# Patient Record
Sex: Female | Born: 1975 | Race: White | Hispanic: No | Marital: Married | Smoking: Current every day smoker
Health system: Southern US, Community
[De-identification: ages and names within clinical notes are randomized; demographics above are authoritative.]

## PROBLEM LIST (undated history)

## (undated) DIAGNOSIS — F32A Depression, unspecified: Secondary | ICD-10-CM

## (undated) DIAGNOSIS — K8020 Calculus of gallbladder without cholecystitis without obstruction: Secondary | ICD-10-CM

## (undated) DIAGNOSIS — E282 Polycystic ovarian syndrome: Secondary | ICD-10-CM

## (undated) DIAGNOSIS — E119 Type 2 diabetes mellitus without complications: Secondary | ICD-10-CM

## (undated) DIAGNOSIS — F419 Anxiety disorder, unspecified: Secondary | ICD-10-CM

## (undated) DIAGNOSIS — F329 Major depressive disorder, single episode, unspecified: Secondary | ICD-10-CM

## (undated) DIAGNOSIS — E039 Hypothyroidism, unspecified: Secondary | ICD-10-CM

## (undated) HISTORY — DX: Type 2 diabetes mellitus without complications: E11.9

## (undated) HISTORY — DX: Polycystic ovarian syndrome: E28.2

## (undated) HISTORY — DX: Calculus of gallbladder without cholecystitis without obstruction: K80.20

## (undated) HISTORY — PX: OTHER SURGICAL HISTORY: SHX169

## (undated) HISTORY — DX: Depression, unspecified: F32.A

## (undated) HISTORY — DX: Anxiety disorder, unspecified: F41.9

---
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
## 2015-02-28 ENCOUNTER — Encounter (HOSPITAL_COMMUNITY): Payer: Self-pay | Admitting: *Deleted

## 2015-02-28 ENCOUNTER — Emergency Department (INDEPENDENT_AMBULATORY_CARE_PROVIDER_SITE_OTHER)
Admission: EM | Admit: 2015-02-28 | Discharge: 2015-02-28 | Disposition: A | Discharge status: Home / Self Care | Payer: 59 | Source: Home / Self Care | Attending: Family Medicine | Admitting: Family Medicine

## 2015-02-28 DIAGNOSIS — A0811 Acute gastroenteropathy due to Norwalk agent: Secondary | ICD-10-CM | POA: Diagnosis not present

## 2015-02-28 MED ORDER — ONDANSETRON 4 MG PO TBDP
ORAL_TABLET | ORAL | Status: AC
  Filled 2015-02-28: qty 2

## 2015-02-28 MED ORDER — ONDANSETRON HCL 4 MG PO TABS: 4.0000 mg | ORAL_TABLET | Freq: Four times a day (QID) | ORAL | Status: DC

## 2015-02-28 MED ORDER — ONDANSETRON 4 MG PO TBDP
8.0000 mg | ORAL_TABLET | Freq: Once | ORAL | Status: AC
  Administered 2015-02-28: 8 mg via ORAL

## 2015-02-28 NOTE — Discharge Instructions (Signed)
Clear liquid , bland diet tonight as tolerated, advance on tues as improved, use medicine as needed for vomiting, imodium for diarrhea, return or see your doctor if any problems.

## 2015-02-28 NOTE — ED Provider Notes (Signed)
CSN: [id number]     Arrival date & time 02/28/15  1836 History   First MD Initiated Contact with Patient 02/28/15 1950     Chief Complaint  Patient presents with  . Emesis   (Consider location/radiation/quality/duration/timing/severity/associated sxs/prior Treatment) Patient is a 40 y.o. female presenting with vomiting. The history is provided by the patient.  Emesis Severity:  Moderate Duration:  14 hours Timing:  Intermittent Number of daily episodes:  6 Quality:  Stomach contents (yellow fluid) Progression:  Unchanged Chronicity:  New Recent urination:  Normal Context comment:  Husband similar sick over weekend. Worsened by:  Nothing tried Ineffective treatments:  None tried Associated symptoms: abdominal pain, chills and diarrhea   Associated symptoms: no fever   Risk factors: sick contacts     History reviewed. No pertinent past medical history. History reviewed. No pertinent past surgical history. History reviewed. No pertinent family history. Social History  Substance Use Topics  . Smoking status: None  . Smokeless tobacco: None  . Alcohol Use: No   OB History    No data available     Review of Systems  Constitutional: Positive for chills.  HENT: Negative.   Gastrointestinal: Positive for nausea, vomiting, abdominal pain and diarrhea.  Genitourinary: Negative.   All other systems reviewed and are negative.   Allergies  Review of patient's allergies indicates no known allergies.  Home Medications   Prior to Admission medications   Medication Sig Start Date End Date Taking? Authorizing Provider  ondansetron (ZOFRAN) 4 MG tablet Take 1 tablet (4 mg total) by mouth every 6 (six) hours. Prn n/v 02/28/15   Billy Fischer, MD   Meds Ordered and Administered this Visit   Medications  ondansetron (ZOFRAN-ODT) disintegrating tablet 8 mg (8 mg Oral Given 02/28/15 2004)    BP 128/88 mmHg  Pulse 118  Temp(Src) 98.2 F (36.8 C) (Oral)  Resp 20  SpO2 100%  LMP  01/30/2015 No data found.   Physical Exam  Constitutional: She is oriented to person, place, and time. She appears well-developed and well-nourished. No distress.  HENT:  Mouth/Throat: Oropharynx is clear and moist.  Neck: Normal range of motion. Neck supple.  Cardiovascular: Normal heart sounds and intact distal pulses.   Pulmonary/Chest: Effort normal and breath sounds normal.  Abdominal: Soft. Normal appearance and bowel sounds are normal. She exhibits no distension and no mass. There is tenderness. There is no rigidity, no rebound, no guarding and no CVA tenderness.    Lymphadenopathy:    She has no cervical adenopathy.  Neurological: She is alert and oriented to person, place, and time.  Skin: Skin is warm and dry.    ED Course  Procedures (including critical care time)  Labs Review Labs Reviewed - No data to display  Imaging Review No results found.   Visual Acuity Review  Right Eye Distance:   Left Eye Distance:   Bilateral Distance:    Right Eye Near:   Left Eye Near:    Bilateral Near:         MDM   1. Gastroenteritis due to norovirus    Meds ordered this encounter  Medications  . ondansetron (ZOFRAN-ODT) disintegrating tablet 8 mg    Sig:   . ondansetron (ZOFRAN) 4 MG tablet    Sig: Take 1 tablet (4 mg total) by mouth every 6 (six) hours. Prn n/v    Dispense:  12 tablet    Refill:  0       Billy Fischer,  MD 02/28/15 2011

## 2015-02-28 NOTE — ED Notes (Signed)
Pt  Reports  Symptoms  Of  Vomiting   Diarrhea     And  Lower r  Side  Abdominal   Pain          Pt  States   She  Woke  Up  With  The  Symptoms  About      4  Am  This  Morning     She  Is  Sitting  Upright on  The  Exam table  Speaking in  Complete  sentances       She  States   Is  Unable  To  Yolonda Kida     Liquids  Down

## 2015-05-09 ENCOUNTER — Other Ambulatory Visit: Payer: Self-pay | Admitting: Nurse Practitioner

## 2015-05-09 DIAGNOSIS — K819 Cholecystitis, unspecified: Secondary | ICD-10-CM

## 2015-05-16 ENCOUNTER — Other Ambulatory Visit: Payer: 59

## 2015-05-16 ENCOUNTER — Other Ambulatory Visit: Payer: Self-pay | Admitting: Nurse Practitioner

## 2015-05-16 DIAGNOSIS — K819 Cholecystitis, unspecified: Secondary | ICD-10-CM

## 2015-05-26 ENCOUNTER — Ambulatory Visit
Admission: RE | Admit: 2015-05-26 | Discharge: 2015-05-26 | Disposition: A | Discharge status: Home / Self Care | Payer: 59 | Source: Ambulatory Visit | Attending: Nurse Practitioner | Admitting: Nurse Practitioner

## 2015-05-26 DIAGNOSIS — K819 Cholecystitis, unspecified: Secondary | ICD-10-CM

## 2015-05-26 MED ORDER — GADOBENATE DIMEGLUMINE 529 MG/ML IV SOLN
20.0000 mL | Freq: Once | INTRAVENOUS | Status: AC | PRN
  Administered 2015-05-26: 20 mL via INTRAVENOUS

## 2017-05-17 ENCOUNTER — Encounter (HOSPITAL_COMMUNITY): Payer: Self-pay | Admitting: Emergency Medicine

## 2017-05-17 ENCOUNTER — Emergency Department (HOSPITAL_COMMUNITY)
Admission: EM | Admit: 2017-05-17 | Discharge: 2017-05-17 | Disposition: A | Discharge status: Home / Self Care | Payer: PRIVATE HEALTH INSURANCE | Attending: Emergency Medicine | Admitting: Emergency Medicine

## 2017-05-17 ENCOUNTER — Other Ambulatory Visit: Payer: Self-pay

## 2017-05-17 DIAGNOSIS — F17210 Nicotine dependence, cigarettes, uncomplicated: Secondary | ICD-10-CM | POA: Insufficient documentation

## 2017-05-17 DIAGNOSIS — B350 Tinea barbae and tinea capitis: Secondary | ICD-10-CM | POA: Diagnosis not present

## 2017-05-17 DIAGNOSIS — R599 Enlarged lymph nodes, unspecified: Secondary | ICD-10-CM

## 2017-05-17 DIAGNOSIS — M542 Cervicalgia: Secondary | ICD-10-CM | POA: Diagnosis present

## 2017-05-17 MED ORDER — TERBINAFINE HCL 250 MG PO TABS: 250.0000 mg | ORAL_TABLET | Freq: Every day | ORAL | 0 refills | Status: DC

## 2017-05-17 MED ORDER — HYDROCODONE-ACETAMINOPHEN 5-325 MG PO TABS: 1.0000 | ORAL_TABLET | Freq: Four times a day (QID) | ORAL | 0 refills | Status: DC | PRN

## 2017-05-17 NOTE — ED Triage Notes (Signed)
Pt. Stated, Beth Savage had a lesion on my scalp for 2 weeks, and a knot on the back of my neck

## 2017-05-17 NOTE — ED Triage Notes (Signed)
Went to 'Dr. Wilburn Mylar and was given a cream

## 2017-05-17 NOTE — ED Provider Notes (Signed)
[facility] EMERGENCY DEPARTMENT Provider Note   CSN: [id number] Arrival date & time: 05/17/17  0920     History   Chief Complaint Chief Complaint  Patient presents with  . Neck Pain    a knot/bump  . Hair/Scalp Problem    HPI Beth Savage is a 42 y.o. female.  HPI Patient presents to the emergency department with neck pain from a swollen lymph node and a lesion to her scalp for the last 2 weeks.  Patient is neck pain is gotten worse over the last 2 days.  Patient states that she was given a cream and advised to return to the emergency department she had any worsening of her condition.  Patient states that the neck pain is the main concern at this point.  Patient states that she did not take any other medications prior to arrival for symptoms.  The patient denies chest pain, shortness of breath, headache,blurred vision,  fever, cough, weakness, numbness, dizziness, anorexia, edema, abdominal pain, nausea, vomiting, diarrhea, rash, back pain, dysuria, hematemesis, bloody stool, near syncope, or syncope. History reviewed. No pertinent past medical history.  There are no active problems to display for this patient.   History reviewed. No pertinent surgical history.   OB History   None      Home Medications    Prior to Admission medications   Medication Sig Start Date End Date Taking? Authorizing Provider  ondansetron (ZOFRAN) 4 MG tablet Take 1 tablet (4 mg total) by mouth every 6 (six) hours. Prn n/v 02/28/15   Billy Fischer, MD    Family History No family history on file.  Social History Social History   Tobacco Use  . Smoking status: Current Every Day Smoker  . Smokeless tobacco: Current User  Substance Use Topics  . Alcohol use: Yes  . Drug use: Not Currently     Allergies   Patient has no known allergies.   Review of Systems Review of Systems All other systems negative except as documented in the HPI. All pertinent positives  and negatives as reviewed in the HPI.  Physical Exam Updated Vital Signs BP (!) 149/94 (BP Location: Right Arm)   Pulse 91   Temp 98.8 F (37.1 C) (Oral)   Resp 16   Ht 5\' 3"  (1.6 m)   Wt 117.9 kg (260 lb)   LMP 04/25/2017   SpO2 99%   BMI 46.06 kg/m   Physical Exam  Constitutional: She is oriented to person, place, and time. She appears well-developed and well-nourished. No distress.  HENT:  Head: Normocephalic and atraumatic.  Eyes: Pupils are equal, round, and reactive to light.  Neck: No spinous process tenderness and no muscular tenderness present. No neck rigidity. Normal range of motion present.    Pulmonary/Chest: Effort normal.  Neurological: She is alert and oriented to person, place, and time. She displays normal reflexes. No sensory deficit. She exhibits normal muscle tone. Coordination normal.  Skin: Skin is warm and dry.  Psychiatric: She has a normal mood and affect.  Nursing note and vitals reviewed.    ED Treatments / Results  Labs (all labs ordered are listed, but only abnormal results are displayed) Labs Reviewed - No data to display  EKG None  Radiology No results found.  Procedures Procedures (including critical care time)  Medications Ordered in ED Medications - No data to display   Initial Impression / Assessment and Plan / ED Course  I have reviewed the triage vital  signs and the nursing notes.  Pertinent labs & imaging results that were available during my care of the patient were reviewed by me and considered in my medical decision making (see chart for details).     Patient be treated for the neck discomfort from the swollen lymph node and I have advised her that we will need to change the cream to an oral medication for the tinea capitis.  The patient is advised to follow-up with her doctor told return here as needed.  Patient agrees to the plan and all questions were answered.  Final Clinical Impressions(s) / ED Diagnoses    Final diagnoses:  None    ED Discharge Orders    None       Dalia Heading, PA-C 05/20/17 1457    Malvin Johns, MD 05/26/17 [phone]

## 2017-05-17 NOTE — Discharge Instructions (Signed)
Follow-up with your primary doctor.  Return here for any worsening in your condition.

## 2018-06-17 ENCOUNTER — Ambulatory Visit (INDEPENDENT_AMBULATORY_CARE_PROVIDER_SITE_OTHER): Payer: PRIVATE HEALTH INSURANCE | Admitting: Obstetrics and Gynecology

## 2018-06-17 ENCOUNTER — Other Ambulatory Visit: Payer: Self-pay

## 2018-06-17 ENCOUNTER — Encounter: Payer: Self-pay | Admitting: Obstetrics and Gynecology

## 2018-06-17 ENCOUNTER — Other Ambulatory Visit (INDEPENDENT_AMBULATORY_CARE_PROVIDER_SITE_OTHER): Payer: PRIVATE HEALTH INSURANCE

## 2018-06-17 VITALS — BP 132/91 | HR 90 | Ht 63.0 in | Wt 269.2 lb

## 2018-06-17 DIAGNOSIS — O09521 Supervision of elderly multigravida, first trimester: Secondary | ICD-10-CM | POA: Insufficient documentation

## 2018-06-17 DIAGNOSIS — E282 Polycystic ovarian syndrome: Secondary | ICD-10-CM

## 2018-06-17 DIAGNOSIS — Z3687 Encounter for antenatal screening for uncertain dates: Secondary | ICD-10-CM

## 2018-06-17 DIAGNOSIS — F329 Major depressive disorder, single episode, unspecified: Secondary | ICD-10-CM

## 2018-06-17 DIAGNOSIS — N926 Irregular menstruation, unspecified: Secondary | ICD-10-CM

## 2018-06-17 DIAGNOSIS — E039 Hypothyroidism, unspecified: Secondary | ICD-10-CM | POA: Insufficient documentation

## 2018-06-17 DIAGNOSIS — F419 Anxiety disorder, unspecified: Secondary | ICD-10-CM

## 2018-06-17 DIAGNOSIS — F32A Depression, unspecified: Secondary | ICD-10-CM

## 2018-06-17 DIAGNOSIS — Z640 Problems related to unwanted pregnancy: Secondary | ICD-10-CM

## 2018-06-17 NOTE — Progress Notes (Signed)
Subjective:    Beth Savage is a 43 y.o. G35P1011 female who presents for evaluation of amenorrhea. She believes she could be pregnant. Pregnancy is not desired. She has previously been seen at [facility] at [facility] [place], [place]), but has relocated to [place].  She notes that she has a history of PCOS and is upset that when her last provider placed her on Metformin for management that this would increase her fertility.  She states that she has a 3 year old daughter and is currently "dating someone but it is not serious".  Reports being with her ex-husband for 12 years and was never able to conceive so has not been on any birth control.  She does not feel that with her medical issues and age that a pregnancy is something that is desired. Sexual Activity: single partner, contraception: none. Current symptoms also include: fatigue, positive office pregnancy test and mood changes. She notes that she has stopped taking her anxiety medicine since she found out she was pregnant.  Last period was normal, however cannot recall exact date, thinks it was somewhere around the third week of March (~ March 20th).    The following portions of the patient's history were reviewed and updated as appropriate:   She  has a past medical history of Anxiety and depression, Diabetes mellitus without complication ([place]), Gallstone, and PCOS (polycystic ovarian syndrome).   She  has a past surgical history that includes no surgery history.   Her family history includes COPD in her mother.   She  reports that she has been smoking. She uses smokeless tobacco. She reports previous alcohol use. She reports previous drug use.    Current Outpatient Medications on File Prior to Visit  Medication Sig Dispense Refill  . levothyroxine (SYNTHROID) 175 MCG tablet Take 175 mcg by mouth daily.    . metFORMIN (GLUCOPHAGE-XR) 750 MG 24 hr tablet Take 750 mg by mouth daily with breakfast.    . Vitamin D,  Ergocalciferol, (DRISDOL) 1.25 MG (50000 UT) CAPS capsule Take 50,000 Units by mouth once a week.    . ALPRAZolam (XANAX) 0.5 MG tablet Take 0.5 mg by mouth 2 (two) times daily as needed for anxiety.     No current facility-administered medications on file prior to visit.    She has No Known Allergies..  Review of Systems Pertinent items noted in HPI and remainder of comprehensive ROS otherwise negative.     Objective:    BP (!) 132/91   Pulse 90   Ht 5\' 3"  (1.6 m)   Wt 269 lb 3.2 oz (122.1 kg)   LMP 04/18/2018   BMI 47.69 kg/m  General: alert, no distress and no acute distress    Lab Review Urine HCG: positive from previous GYN (patient reported).   Imaging:   Patient Name: Beth Savage DOB: Jun 02, 1975 MRN: [id number] ULTRASOUND REPORT  Location: [facility] Date of Service: 06/17/2018   Indications: Unsure LMP Findings:  Nelda [place]se[place] intrauterine pregnancy is visualized with a CRL consistent with [redacted]w[redacted]d gestation, giving an (U/S) EDD of 01/22/2019.  FHR: 171 BPM CRL measurement: 20.8 mm Yolk sac is visualized and appears normal and early anatomy is normal. Amnion: visualized and appears normal   Right Ovary is normal in appearance. Left Ovary is normal appearance. Corpus luteal cyst:  is not visualized Survey of the adnexa demonstrates no adnexal masses. There is no free peritoneal fluid in the cul de sac.  Impression: 1. [redacted]w[redacted]d Viable  Singleton Intrauterine pregnancy by U/S. 2. (U/S) EDD is 01/22/2019.  Recommendations: 1.Clinical correlation with the patient's History and Physical Exam.  Jenine M. Albertine Grates     RDMS   Assessment:   Missed menses Unsure of LMP (last menstrual period) as reason for ultrasound scan PCOS (polycystic ovarian syndrome) Acquired hypothyroidism Advanced maternal age in multigravida, first trimester Unwanted pregnancy with plans for termination Anxiety and depression  Plan:    1. Pregnancy confirmed and dated by  today's ultrasound, currently 8.4 weeks (consistent with patient's guessed LMP).  Discussed options with patient including prenatal care and options for termination. Patient feels that based on her age and current health status, along with increased anxiety and depression surrounding the fact that she became pregnant with someone that she is not in a serious relationship with, that termination is likely the best option for her, but did desire to hear of risks associated with continuing the pregnancy. Given handouts on all options, including a pregnancy loss/termination support group should she go through with the process.  2. Anxiety and depression, patient currently off meds for the pregnancy, discussed that if pregnancy was not desired she could resume her meds.  If she determines that pregnancy is desired, then we can change her to new medications suitable for pregnancy 3. Hypothyroidism and PCOS currently managed with medications.  4. If patient completes termination, can follow up within 2 weeks after procedure and can discuss options for contraception. If plans to continue care at [facility], will request patient's medical records from [place].    A total of 20 minutes were spent face-to-face with the patient during this encounter and over half of that time dealt with counseling and coordination of care.     Rubie Maid, MD [facility]

## 2018-06-17 NOTE — Progress Notes (Signed)
Pt is present today as a new patient that is in the early stages of pregnancy. Pt's LMP 04/18/18. Pt stated that she was doing well but started to cry.

## 2020-03-16 NOTE — Progress Notes (Unsigned)
Office Visit Note  Patient: Beth Savage             Date of Birth: 1975/03/25           MRN: [id number]             PCP: Simona Huh, NP Referring: Simona Huh, NP Visit Date: 03/17/2020 Occupation: [employer] manager [employer]   Subjective:  New Patient (Initial Visit) (Patient complains of bilateral knee pain (right>left) and a pulled muscle on the posterior right thigh. Patient was having quite a bit of joint pain and stiffness prior to losing weight, patient has lost 80 lbs in the last year and a half. )   History of Present Illness: Beth Savage is a 45 y.o. female with a history of PCOS, anxiety, T2DM here for evaluation of positive ANA tests and bilateral knee pain and skin rashes and fatigue. She had much worse knee pain this has improved partially with about 80 lbs weight loss in past 1.5 years with phentermine treatment. She has knee pain mostly with use, and the right knee was recently hurt with a popping sensation and muscle pull in the back. She does not notice any significant swelling. She has persistent rash on the back of the scalp varying in intensity and also on left elbow these have not improved with topical emollients. She also gets itchy scattered red rash on her limbs that become excoriated from itching. She notices clumpy hair thinning on the back of the scalp mostly. She denies eye irritation, lymphadenopathy, oral ulcers, photosensitive rash, raynaud's phenomenon, or history of blood clots.  Labs reviewed 07/2019 ANA pos dsDNA 18 (high) RNP, Sm, SSA, SSB neg TSH 0.001 (low) Free T4 2.3 (high) PTH wnl RF neg CCP neg MPO Ab neg  03/2019 TPO Abs 482 Tg Abs >2250  Imaging reviewed 07/2019 ABIs - Normal indices b/l Lower extremity US - No significant clots or stenosis Carotid US - No significant clots or stenosis   Activities of Daily Living:  Patient reports morning stiffness for 0 minutes.   Patient Denies nocturnal pain.  Difficulty  dressing/grooming: Denies Difficulty climbing stairs: Denies Difficulty getting out of chair: Denies Difficulty using hands for taps, buttons, cutlery, and/or writing: Denies  Review of Systems  Constitutional: Positive for fatigue.  HENT: Negative for mouth sores, mouth dryness and nose dryness.   Eyes: Positive for visual disturbance. Negative for pain, itching and dryness.  Respiratory: Negative for cough, hemoptysis, shortness of breath and difficulty breathing.   Cardiovascular: Negative for chest pain, palpitations and swelling in legs/feet.  Gastrointestinal: Negative for abdominal pain, blood in stool, constipation and diarrhea.  Endocrine: Negative for increased urination.  Genitourinary: Negative for painful urination.  Musculoskeletal: Positive for arthralgias, joint pain, myalgias, muscle weakness and myalgias. Negative for joint swelling, morning stiffness and muscle tenderness.  Skin: Positive for redness. Negative for color change and rash.  Allergic/Immunologic: Negative for susceptible to infections.  Neurological: Positive for dizziness and headaches. Negative for numbness, memory loss and weakness.  Hematological: Positive for swollen glands.  Psychiatric/Behavioral: Positive for sleep disturbance. Negative for confusion.    PMFS History:  Patient Active Problem List   Diagnosis Date Noted  . Rash and other nonspecific skin eruption 03/18/2020  . Positive ANA (antinuclear antibody) 03/17/2020  . Pain in right knee 03/17/2020  . PCOS (polycystic ovarian syndrome) 06/17/2018  . Acquired hypothyroidism 06/17/2018  . Unwanted pregnancy with plans for termination 06/17/2018  . Anxiety and depression 06/17/2018  .  Advanced maternal age in multigravida, first trimester 06/17/2018    Past Medical History:  Diagnosis Date  . Anxiety and depression   . Diabetes mellitus without complication ([place])   . Gallstone   . PCOS (polycystic ovarian syndrome)     Family History   Problem Relation Age of Onset  . COPD Mother   . Heart disease Mother   . Rheum arthritis Maternal Grandmother   . Asthma Daughter   . Eczema Daughter    Past Surgical History:  Procedure Laterality Date  . no surgery history     Social History   Social History Narrative  . Not on file    There is no immunization history on file for this patient.   Objective: Vital Signs: BP (!) 151/88 (BP Location: Right Arm, Patient Position: Sitting, Cuff Size: Normal)   Pulse (!) 106   Ht 5' 4.25" (1.632 m)   Wt 208 lb 6.4 oz (94.5 kg)   Breastfeeding Unknown   BMI 35.49 kg/m    Physical Exam Constitutional:      Appearance: She is obese.  HENT:     Right Ear: External ear normal.     Left Ear: External ear normal.     Mouth/Throat:     Mouth: Mucous membranes are moist.     Pharynx: Oropharynx is clear.  Eyes:     Conjunctiva/sclera: Conjunctivae normal.  Cardiovascular:     Rate and Rhythm: Regular rhythm. Tachycardia present.  Pulmonary:     Effort: Pulmonary effort is normal.     Breath sounds: Normal breath sounds.  Skin:    General: Skin is warm and dry.     Findings: Rash present.     Comments: Erythematous patches with overlying scale on occiput and on left elbow extensor surface, scattered excoriations on extremities, no residual pigmentation changes with rash areas  Neurological:     General: No focal deficit present.     Mental Status: She is alert.  Psychiatric:        Mood and Affect: Mood normal.    Musculoskeletal Exam:  Neck full ROM no tenderness Shoulders full ROM no tenderness or swelling Elbows full ROM no tenderness or swelling Wrists full ROM no tenderness or swelling Fingers full ROM no tenderness or swelling Knees full ROM right side patellofemoral crepitus no swelling, tender area over posterior at hamstring tendon but no pain with resisted flexion, left knee normal Ankles full ROM no tenderness or swelling MTPs full ROM no tenderness or  swelling  Investigation: No additional findings.  Imaging: No results found.  Recent Labs: No results found for: WBC, HGB, PLT, NA, K, CL, CO2, GLUCOSE, BUN, CREATININE, BILITOT, ALKPHOS, AST, ALT, PROT, ALBUMIN, CALCIUM, GFRAA, QFTBGOLD, QFTBGOLDPLUS  Speciality Comments: No specialty comments available.  Procedures:  No procedures performed Allergies: Patient has no known allergies.   Assessment / Plan:     Visit Diagnoses: Positive ANA (antinuclear antibody) - Plan: Anti-DNA antibody, double-stranded, C3 and C4, Sedimentation rate  Positive ANA with also a positive double-stranded DNA but does not present clinical criteria of systemic lupus at this time.  Joint pain but no inflammation or swelling seen.  We will check some rotation right and complements also repeat double-stranded DNA.  I am more suspicious for endocrine problems associate with this with her very high thyroid peroxidase and thyroglobulin antibody titers.  Chronic pain of right knee - Plan: Anti-DNA antibody, double-stranded, C3 and C4, Sedimentation rate  Chronic knee pain with sometimes swelling,  worst over the posterior knee although no large palpable cyst is present.  No weakness no pain on resisted hamstring movement so I do not suspect a serious acute problem at this time.  Rash and other nonspecific skin eruption  Skin rash on the occiput and elbow appears consistent with plaque psoriasis.  She has an upcoming appointment scheduled for dermatology I recommended she see them and requests skin biopsy for evaluation.  If this represents psoriasis would make seronegative arthritis a more likely possibility.  Orders: Orders Placed This Encounter  Procedures  . Anti-DNA antibody, double-stranded  . C3 and C4  . Sedimentation rate   No orders of the defined types were placed in this encounter.   Follow-Up Instructions: Return in about 2 months (around 05/15/2020) for Positive ana, rash,  arthralgia.   Collier Salina, MD  Note - This record has been created using [employer].  Chart creation errors have been sought, but may not always  have been located. Such creation errors do not reflect on  the standard of medical care.

## 2020-03-17 ENCOUNTER — Encounter: Payer: Self-pay | Admitting: Internal Medicine

## 2020-03-17 ENCOUNTER — Ambulatory Visit (INDEPENDENT_AMBULATORY_CARE_PROVIDER_SITE_OTHER): Payer: BLUE CROSS/BLUE SHIELD | Admitting: Internal Medicine

## 2020-03-17 ENCOUNTER — Other Ambulatory Visit: Payer: Self-pay

## 2020-03-17 VITALS — BP 151/88 | HR 106 | Ht 64.25 in | Wt 208.4 lb

## 2020-03-17 DIAGNOSIS — M25561 Pain in right knee: Secondary | ICD-10-CM | POA: Diagnosis not present

## 2020-03-17 DIAGNOSIS — R21 Rash and other nonspecific skin eruption: Secondary | ICD-10-CM | POA: Diagnosis not present

## 2020-03-17 DIAGNOSIS — R7689 Other specified abnormal immunological findings in serum: Secondary | ICD-10-CM | POA: Insufficient documentation

## 2020-03-17 DIAGNOSIS — G8929 Other chronic pain: Secondary | ICD-10-CM | POA: Diagnosis not present

## 2020-03-17 DIAGNOSIS — R768 Other specified abnormal immunological findings in serum: Secondary | ICD-10-CM | POA: Diagnosis not present

## 2020-03-17 LAB — SEDIMENTATION RATE: Sed Rate: 28 mm/h — ABNORMAL HIGH (ref 0–20)

## 2020-03-18 DIAGNOSIS — R21 Rash and other nonspecific skin eruption: Secondary | ICD-10-CM | POA: Insufficient documentation

## 2020-03-18 LAB — C3 AND C4
C3 Complement: 161 mg/dL (ref 83–193)
C4 Complement: 26 mg/dL (ref 15–57)

## 2020-03-18 LAB — ANTI-DNA ANTIBODY, DOUBLE-STRANDED: ds DNA Ab: 30 IU/mL — ABNORMAL HIGH

## 2020-03-21 NOTE — Progress Notes (Signed)
Labs show dsDNA is positive on repeat testing, inflammatory markers very slightly above normal. Complements, a marker for disease activity in some lupus patients, are normal. I believe further investigation of her rashes as we discussed will be helpful, since she has positive lab tests but symptoms are not typical for systemic lupus, they look more like psoriasis.

## 2020-05-12 ENCOUNTER — Ambulatory Visit: Payer: BLUE CROSS/BLUE SHIELD | Admitting: Internal Medicine

## 2020-05-12 NOTE — Progress Notes (Deleted)
   Office Visit Note  Patient: Beth Savage             Date of Birth: 09/15/1975           MRN: [id number]             PCP: Simona Huh, NP Referring: Simona Huh, NP Visit Date: 05/12/2020   Subjective:  No chief complaint on file.   History of Present Illness: Beth Savage is a 45 y.o. female here for follow up with joint pain and skin rashes with positive dsDNA Abs but lesions appearing like psoriasis. She also showed very high Tg Abs and TSH had been very low.***     No Rheumatology ROS completed.   PMFS History:  Patient Active Problem List   Diagnosis Date Noted  . Rash and other nonspecific skin eruption 03/18/2020  . Positive ANA (antinuclear antibody) 03/17/2020  . Pain in right knee 03/17/2020  . PCOS (polycystic ovarian syndrome) 06/17/2018  . Acquired hypothyroidism 06/17/2018  . Unwanted pregnancy with plans for termination 06/17/2018  . Anxiety and depression 06/17/2018  . Advanced maternal age in multigravida, first trimester 06/17/2018    Past Medical History:  Diagnosis Date  . Anxiety and depression   . Diabetes mellitus without complication ([place])   . Gallstone   . PCOS (polycystic ovarian syndrome)     Family History  Problem Relation Age of Onset  . COPD Mother   . Heart disease Mother   . Rheum arthritis Maternal Grandmother   . Asthma Daughter   . Eczema Daughter    Past Surgical History:  Procedure Laterality Date  . no surgery history     Social History   Social History Narrative  . Not on file    There is no immunization history on file for this patient.   Objective: Vital Signs: There were no vitals taken for this visit.   Physical Exam   Musculoskeletal Exam: ***  CDAI Exam: CDAI Score: -- Patient Global: --; Provider Global: -- Swollen: --; Tender: -- Joint Exam 05/12/2020   No joint exam has been documented for this visit   There is currently no information documented on the homunculus. Go to the  Rheumatology activity and complete the homunculus joint exam.  Investigation: No additional findings.  Imaging: No results found.  Recent Labs: No results found for: WBC, HGB, PLT, NA, K, CL, CO2, GLUCOSE, BUN, CREATININE, BILITOT, ALKPHOS, AST, ALT, PROT, ALBUMIN, CALCIUM, GFRAA, QFTBGOLD, QFTBGOLDPLUS  Speciality Comments: No specialty comments available.  Procedures:  No procedures performed Allergies: Patient has no known allergies.   Assessment / Plan:     Visit Diagnoses: No diagnosis found.  ***  Orders: No orders of the defined types were placed in this encounter.  No orders of the defined types were placed in this encounter.    Follow-Up Instructions: No follow-ups on file.   Collier Salina, MD  Note - This record has been created using [employer].  Chart creation errors have been sought, but may not always  have been located. Such creation errors do not reflect on  the standard of medical care.

## 2021-03-20 ENCOUNTER — Other Ambulatory Visit: Payer: Self-pay

## 2021-03-20 ENCOUNTER — Emergency Department: Payer: BC Managed Care – PPO

## 2021-03-20 ENCOUNTER — Encounter: Payer: Self-pay | Admitting: Emergency Medicine

## 2021-03-20 DIAGNOSIS — K819 Cholecystitis, unspecified: Secondary | ICD-10-CM | POA: Diagnosis not present

## 2021-03-20 DIAGNOSIS — F17210 Nicotine dependence, cigarettes, uncomplicated: Secondary | ICD-10-CM | POA: Diagnosis present

## 2021-03-20 DIAGNOSIS — Z79890 Hormone replacement therapy (postmenopausal): Secondary | ICD-10-CM

## 2021-03-20 DIAGNOSIS — Z79899 Other long term (current) drug therapy: Secondary | ICD-10-CM

## 2021-03-20 DIAGNOSIS — F419 Anxiety disorder, unspecified: Secondary | ICD-10-CM | POA: Diagnosis present

## 2021-03-20 DIAGNOSIS — E119 Type 2 diabetes mellitus without complications: Secondary | ICD-10-CM | POA: Diagnosis present

## 2021-03-20 DIAGNOSIS — K8042 Calculus of bile duct with acute cholecystitis without obstruction: Principal | ICD-10-CM | POA: Diagnosis present

## 2021-03-20 DIAGNOSIS — N838 Other noninflammatory disorders of ovary, fallopian tube and broad ligament: Secondary | ICD-10-CM | POA: Diagnosis present

## 2021-03-20 DIAGNOSIS — N8302 Follicular cyst of left ovary: Secondary | ICD-10-CM | POA: Diagnosis present

## 2021-03-20 DIAGNOSIS — R1011 Right upper quadrant pain: Secondary | ICD-10-CM

## 2021-03-20 DIAGNOSIS — E282 Polycystic ovarian syndrome: Secondary | ICD-10-CM | POA: Diagnosis present

## 2021-03-20 DIAGNOSIS — K5900 Constipation, unspecified: Secondary | ICD-10-CM | POA: Diagnosis present

## 2021-03-20 DIAGNOSIS — Z20822 Contact with and (suspected) exposure to covid-19: Secondary | ICD-10-CM | POA: Diagnosis present

## 2021-03-20 DIAGNOSIS — E039 Hypothyroidism, unspecified: Secondary | ICD-10-CM | POA: Diagnosis present

## 2021-03-20 DIAGNOSIS — Z7984 Long term (current) use of oral hypoglycemic drugs: Secondary | ICD-10-CM

## 2021-03-20 DIAGNOSIS — Z6841 Body Mass Index (BMI) 40.0 and over, adult: Secondary | ICD-10-CM

## 2021-03-20 DIAGNOSIS — D250 Submucous leiomyoma of uterus: Secondary | ICD-10-CM | POA: Diagnosis present

## 2021-03-20 DIAGNOSIS — F32A Depression, unspecified: Secondary | ICD-10-CM | POA: Diagnosis present

## 2021-03-20 LAB — URINALYSIS, ROUTINE W REFLEX MICROSCOPIC
Bilirubin Urine: NEGATIVE
Glucose, UA: NEGATIVE mg/dL
Hgb urine dipstick: NEGATIVE
Ketones, ur: NEGATIVE mg/dL
Leukocytes,Ua: NEGATIVE
Nitrite: NEGATIVE
Protein, ur: NEGATIVE mg/dL
Specific Gravity, Urine: 1.015 (ref 1.005–1.030)
pH: 6 (ref 5.0–8.0)

## 2021-03-20 LAB — COMPREHENSIVE METABOLIC PANEL
ALT: 18 U/L (ref 0–44)
AST: 23 U/L (ref 15–41)
Albumin: 3.8 g/dL (ref 3.5–5.0)
Alkaline Phosphatase: 54 U/L (ref 38–126)
Anion gap: 9 (ref 5–15)
BUN: 8 mg/dL (ref 6–20)
CO2: 26 mmol/L (ref 22–32)
Calcium: 8.8 mg/dL — ABNORMAL LOW (ref 8.9–10.3)
Chloride: 99 mmol/L (ref 98–111)
Creatinine, Ser: 0.57 mg/dL (ref 0.44–1.00)
GFR, Estimated: >60 mL/min (ref >60)
Glucose, Bld: 115 mg/dL — ABNORMAL HIGH (ref 70–99)
Potassium: 3.9 mmol/L (ref 3.5–5.1)
Sodium: 134 mmol/L — ABNORMAL LOW (ref 135–145)
Total Bilirubin: 0.6 mg/dL (ref 0.3–1.2)
Total Protein: 7.5 g/dL (ref 6.5–8.1)

## 2021-03-20 LAB — CBC WITH DIFFERENTIAL/PLATELET
Abs Immature Granulocytes: 0.04 10*3/uL (ref 0.00–0.07)
Basophils Absolute: 0 10*3/uL (ref 0.0–0.1)
Basophils Relative: 0 %
Eosinophils Absolute: 0.2 10*3/uL (ref 0.0–0.5)
Eosinophils Relative: 2 %
HCT: 40.2 % (ref 36.0–46.0)
Hemoglobin: 12.8 g/dL (ref 12.0–15.0)
Immature Granulocytes: 0 %
Lymphocytes Relative: 22 %
Lymphs Abs: 2.6 10*3/uL (ref 0.7–4.0)
MCH: 26.9 pg (ref 26.0–34.0)
MCHC: 31.8 g/dL (ref 30.0–36.0)
MCV: 84.6 fL (ref 80.0–100.0)
Monocytes Absolute: 0.8 10*3/uL (ref 0.1–1.0)
Monocytes Relative: 7 %
Neutro Abs: 8.3 10*3/uL — ABNORMAL HIGH (ref 1.7–7.7)
Neutrophils Relative %: 69 %
Platelets: 411 10*3/uL — ABNORMAL HIGH (ref 150–400)
RBC: 4.75 MIL/uL (ref 3.87–5.11)
RDW: 13.5 % (ref 11.5–15.5)
WBC: 11.9 10*3/uL — ABNORMAL HIGH (ref 4.0–10.5)
nRBC: 0 % (ref 0.0–0.2)

## 2021-03-20 LAB — LIPASE, BLOOD: Lipase: 40 U/L (ref 11–51)

## 2021-03-20 LAB — POC URINE PREG, ED: Preg Test, Ur: NEGATIVE

## 2021-03-20 LAB — RESP PANEL BY RT-PCR (FLU A&B, COVID) ARPGX2
Influenza A by PCR: NEGATIVE
Influenza B by PCR: NEGATIVE
SARS Coronavirus 2 by RT PCR: NEGATIVE

## 2021-03-20 MED ORDER — OXYCODONE-ACETAMINOPHEN 5-325 MG PO TABS
1.0000 | ORAL_TABLET | Freq: Once | ORAL | Status: AC
  Administered 2021-03-20: 1 via ORAL
  Filled 2021-03-20: qty 1

## 2021-03-20 NOTE — ED Triage Notes (Signed)
Pt to ED from home c/o RUQ pain today, nausea and vomiting and diarrhea.  Pain with palpation, pt uncomfortable, hx gallstones and worse with eating.  Roderic Palau, [place] in triage for MSE.

## 2021-03-20 NOTE — ED Provider Triage Note (Signed)
Emergency Medicine Provider Triage Evaluation Note  Beth Savage , a 46 y.o. female  was evaluated in triage.  Pt complains of severe right upper quadrant abdominal pain, chills and nausea and vomiting.  Symptoms began 2 days ago.  They have not stopped.  There is no diarrhea or constipation.  No urinary symptoms.  Patient denies any URI symptoms.  Still has her gallbladder and appendix..  Review of Systems  Positive: Right upper quadrant pain with nausea and vomiting Negative: Urinary symptoms, diarrhea constipation, URI symptoms  Physical Exam  BP (!) 182/94 (BP Location: Right Arm)    Pulse 97    Temp 98.8 F (37.1 C) (Oral)    Resp 18    Ht 5\' 2"  (1.575 m)    Wt 99.8 kg    LMP 03/05/2021    SpO2 100%    BMI 40.24 kg/m  Gen:   Awake, no distress   Resp:  Normal effort  MSK:   Moves extremities without difficulty  Other:  Bowel sounds present.  Patient has significant tenderness in the right upper quadrant with guarding.  Positive Murphy sign.  Medical Decision Making  Medically screening exam initiated at 8:30 PM.  Appropriate orders placed.  Avrie Kedzierski Arena was informed that the remainder of the evaluation will be completed by another provider, this initial triage assessment does not replace that evaluation, and the importance of remaining in the ED until their evaluation is complete.  Patient presented with right upper quadrant abdominal pain starting 2 days ago with nausea and vomiting.  Chills but no fever.  No URI symptoms.  Patient will have labs, ultrasound at this time.   Darletta Moll, PA-C 03/20/21 2030

## 2021-03-21 ENCOUNTER — Inpatient Hospital Stay: Payer: BC Managed Care – PPO

## 2021-03-21 ENCOUNTER — Inpatient Hospital Stay: Payer: BC Managed Care – PPO | Admitting: Anesthesiology

## 2021-03-21 ENCOUNTER — Inpatient Hospital Stay: Payer: BC Managed Care – PPO | Admitting: Certified Registered Nurse Anesthetist

## 2021-03-21 ENCOUNTER — Encounter
Admission: EM | Disposition: A | Discharge status: Home / Self Care | Payer: Self-pay | Source: Home / Self Care | Attending: Internal Medicine

## 2021-03-21 ENCOUNTER — Other Ambulatory Visit: Payer: Self-pay

## 2021-03-21 ENCOUNTER — Encounter: Payer: Self-pay | Admitting: Internal Medicine

## 2021-03-21 ENCOUNTER — Inpatient Hospital Stay
Admission: EM | Admit: 2021-03-21 | Discharge: 2021-03-27 | DRG: 418 | Disposition: A | Discharge status: Home / Self Care | Payer: BC Managed Care – PPO | Attending: Internal Medicine | Admitting: Internal Medicine

## 2021-03-21 ENCOUNTER — Emergency Department: Payer: BC Managed Care – PPO

## 2021-03-21 DIAGNOSIS — F419 Anxiety disorder, unspecified: Secondary | ICD-10-CM | POA: Diagnosis present

## 2021-03-21 DIAGNOSIS — K5900 Constipation, unspecified: Secondary | ICD-10-CM | POA: Diagnosis present

## 2021-03-21 DIAGNOSIS — Z6841 Body Mass Index (BMI) 40.0 and over, adult: Secondary | ICD-10-CM | POA: Diagnosis not present

## 2021-03-21 DIAGNOSIS — R932 Abnormal findings on diagnostic imaging of liver and biliary tract: Secondary | ICD-10-CM | POA: Diagnosis not present

## 2021-03-21 DIAGNOSIS — R109 Unspecified abdominal pain: Secondary | ICD-10-CM

## 2021-03-21 DIAGNOSIS — K8000 Calculus of gallbladder with acute cholecystitis without obstruction: Secondary | ICD-10-CM | POA: Diagnosis not present

## 2021-03-21 DIAGNOSIS — Z79890 Hormone replacement therapy (postmenopausal): Secondary | ICD-10-CM | POA: Diagnosis not present

## 2021-03-21 DIAGNOSIS — R1011 Right upper quadrant pain: Secondary | ICD-10-CM

## 2021-03-21 DIAGNOSIS — K8042 Calculus of bile duct with acute cholecystitis without obstruction: Secondary | ICD-10-CM | POA: Diagnosis present

## 2021-03-21 DIAGNOSIS — F17210 Nicotine dependence, cigarettes, uncomplicated: Secondary | ICD-10-CM | POA: Diagnosis present

## 2021-03-21 DIAGNOSIS — F32A Depression, unspecified: Secondary | ICD-10-CM | POA: Diagnosis present

## 2021-03-21 DIAGNOSIS — Z9049 Acquired absence of other specified parts of digestive tract: Secondary | ICD-10-CM | POA: Diagnosis not present

## 2021-03-21 DIAGNOSIS — E119 Type 2 diabetes mellitus without complications: Secondary | ICD-10-CM | POA: Diagnosis present

## 2021-03-21 DIAGNOSIS — E282 Polycystic ovarian syndrome: Secondary | ICD-10-CM | POA: Diagnosis present

## 2021-03-21 DIAGNOSIS — N83209 Unspecified ovarian cyst, unspecified side: Secondary | ICD-10-CM

## 2021-03-21 DIAGNOSIS — N838 Other noninflammatory disorders of ovary, fallopian tube and broad ligament: Secondary | ICD-10-CM | POA: Diagnosis present

## 2021-03-21 DIAGNOSIS — E039 Hypothyroidism, unspecified: Secondary | ICD-10-CM | POA: Diagnosis present

## 2021-03-21 DIAGNOSIS — Z7984 Long term (current) use of oral hypoglycemic drugs: Secondary | ICD-10-CM | POA: Diagnosis not present

## 2021-03-21 DIAGNOSIS — N83202 Unspecified ovarian cyst, left side: Secondary | ICD-10-CM | POA: Diagnosis not present

## 2021-03-21 DIAGNOSIS — R1030 Lower abdominal pain, unspecified: Secondary | ICD-10-CM | POA: Diagnosis not present

## 2021-03-21 DIAGNOSIS — K819 Cholecystitis, unspecified: Secondary | ICD-10-CM

## 2021-03-21 DIAGNOSIS — D250 Submucous leiomyoma of uterus: Secondary | ICD-10-CM | POA: Diagnosis present

## 2021-03-21 DIAGNOSIS — R52 Pain, unspecified: Secondary | ICD-10-CM

## 2021-03-21 DIAGNOSIS — Z79899 Other long term (current) drug therapy: Secondary | ICD-10-CM | POA: Diagnosis not present

## 2021-03-21 DIAGNOSIS — N8302 Follicular cyst of left ovary: Secondary | ICD-10-CM | POA: Diagnosis present

## 2021-03-21 DIAGNOSIS — Z20822 Contact with and (suspected) exposure to covid-19: Secondary | ICD-10-CM | POA: Diagnosis present

## 2021-03-21 HISTORY — DX: Hypothyroidism, unspecified: E03.9

## 2021-03-21 HISTORY — PX: ERCP: SHX5425

## 2021-03-21 LAB — CBG MONITORING, ED
Glucose-Capillary: 96 mg/dL (ref 70–99)
Glucose-Capillary: 98 mg/dL (ref 70–99)
Glucose-Capillary: 173 mg/dL — ABNORMAL HIGH (ref 70–99)

## 2021-03-21 LAB — TSH: TSH: 0.429 u[IU]/mL (ref 0.350–4.500)

## 2021-03-21 LAB — GLUCOSE, CAPILLARY: Glucose-Capillary: 168 mg/dL — ABNORMAL HIGH (ref 70–99)

## 2021-03-21 SURGERY — CHOLECYSTECTOMY, ROBOT-ASSISTED, LAPAROSCOPIC
Anesthesia: General | Site: Abdomen

## 2021-03-21 SURGERY — ERCP, WITH INTERVENTION IF INDICATED
Anesthesia: General

## 2021-03-21 MED ORDER — INDOMETHACIN 50 MG RE SUPP
RECTAL | Status: DC | PRN
  Administered 2021-03-21: 100 mg via RECTAL

## 2021-03-21 MED ORDER — HYDROMORPHONE HCL 1 MG/ML IJ SOLN
0.5000 mg | Freq: Once | INTRAMUSCULAR | Status: AC
  Administered 2021-03-21: 0.5 mg via INTRAVENOUS
  Filled 2021-03-21: qty 1

## 2021-03-21 MED ORDER — SUGAMMADEX SODIUM 200 MG/2ML IV SOLN
INTRAVENOUS | Status: DC | PRN
  Administered 2021-03-21: 300 mg via INTRAVENOUS

## 2021-03-21 MED ORDER — PROPOFOL 10 MG/ML IV BOLUS
INTRAVENOUS | Status: DC | PRN
  Administered 2021-03-21: 200 mg via INTRAVENOUS

## 2021-03-21 MED ORDER — ALBUTEROL SULFATE HFA 108 (90 BASE) MCG/ACT IN AERS
INHALATION_SPRAY | RESPIRATORY_TRACT | Status: DC | PRN
  Administered 2021-03-21: 6 via RESPIRATORY_TRACT

## 2021-03-21 MED ORDER — ACETAMINOPHEN 325 MG PO TABS
650.0000 mg | ORAL_TABLET | Freq: Four times a day (QID) | ORAL | Status: DC | PRN
Start: 2021-03-21 — End: 2021-03-27
  Administered 2021-03-22 – 2021-03-24 (×5): 650 mg via ORAL
  Filled 2021-03-21 (×6): qty 2

## 2021-03-21 MED ORDER — SODIUM CHLORIDE 0.9 % IR SOLN
Status: DC | PRN
  Administered 2021-03-21: 500 mL

## 2021-03-21 MED ORDER — KETOROLAC TROMETHAMINE 30 MG/ML IJ SOLN: 30.0000 mg | INTRAMUSCULAR | Status: AC

## 2021-03-21 MED ORDER — ACETAMINOPHEN 10 MG/ML IV SOLN: 1000.0000 mg | Freq: Once | INTRAVENOUS | Status: DC | PRN

## 2021-03-21 MED ORDER — FENTANYL CITRATE (PF) 100 MCG/2ML IJ SOLN
INTRAMUSCULAR | Status: AC
  Administered 2021-03-21: 25 ug via INTRAVENOUS
  Filled 2021-03-21: qty 2

## 2021-03-21 MED ORDER — ROCURONIUM BROMIDE 100 MG/10ML IV SOLN
INTRAVENOUS | Status: DC | PRN
  Administered 2021-03-21: 10 mg via INTRAVENOUS
  Administered 2021-03-21: 40 mg via INTRAVENOUS

## 2021-03-21 MED ORDER — HYDROMORPHONE HCL 1 MG/ML IJ SOLN
0.5000 mg | INTRAMUSCULAR | Status: DC | PRN
  Administered 2021-03-21: 1 mg via INTRAVENOUS
  Filled 2021-03-21: qty 1

## 2021-03-21 MED ORDER — ONDANSETRON HCL 4 MG/2ML IJ SOLN
INTRAMUSCULAR | Status: AC
  Filled 2021-03-21: qty 2

## 2021-03-21 MED ORDER — LACTATED RINGERS IV BOLUS
1000.0000 mL | Freq: Once | INTRAVENOUS | Status: AC
  Administered 2021-03-21: 1000 mL via INTRAVENOUS

## 2021-03-21 MED ORDER — FENTANYL CITRATE (PF) 100 MCG/2ML IJ SOLN
INTRAMUSCULAR | Status: AC
  Filled 2021-03-21: qty 2

## 2021-03-21 MED ORDER — PIPERACILLIN-TAZOBACTAM 3.375 G IVPB 30 MIN
3.3750 g | Freq: Once | INTRAVENOUS | Status: AC
Start: 2021-03-21 — End: 2021-03-21
  Administered 2021-03-21: 3.375 g via INTRAVENOUS
  Filled 2021-03-21: qty 50

## 2021-03-21 MED ORDER — DEXMEDETOMIDINE (PRECEDEX) IN NS 20 MCG/5ML (4 MCG/ML) IV SYRINGE
PREFILLED_SYRINGE | INTRAVENOUS | Status: DC | PRN
  Administered 2021-03-21 (×3): 4 ug via INTRAVENOUS

## 2021-03-21 MED ORDER — ONDANSETRON HCL 4 MG/2ML IJ SOLN
4.0000 mg | Freq: Four times a day (QID) | INTRAMUSCULAR | Status: DC | PRN
  Administered 2021-03-23: 4 mg via INTRAVENOUS
  Filled 2021-03-21: qty 2

## 2021-03-21 MED ORDER — KETOROLAC TROMETHAMINE 30 MG/ML IJ SOLN
INTRAMUSCULAR | Status: AC
  Administered 2021-03-21: 30 mg via INTRAVENOUS
  Filled 2021-03-21: qty 1

## 2021-03-21 MED ORDER — FENTANYL CITRATE (PF) 100 MCG/2ML IJ SOLN
INTRAMUSCULAR | Status: DC | PRN
  Administered 2021-03-21: 50 ug via INTRAVENOUS

## 2021-03-21 MED ORDER — MIDAZOLAM HCL 2 MG/2ML IJ SOLN
INTRAMUSCULAR | Status: AC
  Filled 2021-03-21: qty 2

## 2021-03-21 MED ORDER — INSULIN ASPART 100 UNIT/ML IJ SOLN
0.0000 [IU] | INTRAMUSCULAR | Status: DC
  Administered 2021-03-22 (×4): 3 [IU] via SUBCUTANEOUS
  Administered 2021-03-23 (×2): 2 [IU] via SUBCUTANEOUS
  Administered 2021-03-23: 3 [IU] via SUBCUTANEOUS
  Administered 2021-03-23 – 2021-03-24 (×2): 2 [IU] via SUBCUTANEOUS
  Administered 2021-03-24: 3 [IU] via SUBCUTANEOUS
  Filled 2021-03-21 (×11): qty 1

## 2021-03-21 MED ORDER — DEXMEDETOMIDINE (PRECEDEX) IN NS 20 MCG/5ML (4 MCG/ML) IV SYRINGE
PREFILLED_SYRINGE | INTRAVENOUS | Status: AC
  Filled 2021-03-21: qty 5

## 2021-03-21 MED ORDER — FENTANYL CITRATE (PF) 100 MCG/2ML IJ SOLN
25.0000 ug | INTRAMUSCULAR | Status: DC | PRN
  Administered 2021-03-21: 25 ug via INTRAVENOUS
  Administered 2021-03-21: 50 ug via INTRAVENOUS
  Administered 2021-03-21: 25 ug via INTRAVENOUS

## 2021-03-21 MED ORDER — GADOBUTROL 1 MMOL/ML IV SOLN
10.0000 mL | Freq: Once | INTRAVENOUS | Status: AC | PRN
  Administered 2021-03-21: 10 mL via INTRAVENOUS

## 2021-03-21 MED ORDER — HYDROMORPHONE HCL 1 MG/ML IJ SOLN
INTRAMUSCULAR | Status: AC
  Filled 2021-03-21: qty 1

## 2021-03-21 MED ORDER — INDOCYANINE GREEN 25 MG IV SOLR
1.2500 mg | Freq: Once | INTRAVENOUS | Status: AC
  Administered 2021-03-21: 1.25 mg via INTRAVENOUS
  Filled 2021-03-21: qty 0.5

## 2021-03-21 MED ORDER — OXYCODONE HCL 5 MG PO TABS
5.0000 mg | ORAL_TABLET | Freq: Once | ORAL | Status: AC | PRN
  Administered 2021-03-21: 5 mg via ORAL

## 2021-03-21 MED ORDER — LACTATED RINGERS IV SOLN: INTRAVENOUS | Status: DC

## 2021-03-21 MED ORDER — CEFAZOLIN SODIUM 1 G IJ SOLR
INTRAMUSCULAR | Status: AC
  Filled 2021-03-21: qty 20

## 2021-03-21 MED ORDER — PIPERACILLIN-TAZOBACTAM 3.375 G IVPB
3.3750 g | Freq: Three times a day (TID) | INTRAVENOUS | Status: AC
  Administered 2021-03-21 – 2021-03-26 (×13): 3.375 g via INTRAVENOUS
  Filled 2021-03-21 (×14): qty 50

## 2021-03-21 MED ORDER — OXYCODONE-ACETAMINOPHEN 5-325 MG PO TABS
1.0000 | ORAL_TABLET | ORAL | Status: DC | PRN
  Administered 2021-03-21 – 2021-03-22 (×2): 1 via ORAL
  Filled 2021-03-21 (×2): qty 1

## 2021-03-21 MED ORDER — DEXAMETHASONE SODIUM PHOSPHATE 10 MG/ML IJ SOLN
INTRAMUSCULAR | Status: DC | PRN
  Administered 2021-03-21: 10 mg via INTRAVENOUS

## 2021-03-21 MED ORDER — FENTANYL CITRATE (PF) 100 MCG/2ML IJ SOLN
INTRAMUSCULAR | Status: DC | PRN
  Administered 2021-03-21: 25 ug via INTRAVENOUS
  Administered 2021-03-21: 50 ug via INTRAVENOUS
  Administered 2021-03-21: 25 ug via INTRAVENOUS
  Administered 2021-03-21: 50 ug via INTRAVENOUS

## 2021-03-21 MED ORDER — FENTANYL CITRATE PF 50 MCG/ML IJ SOSY: 25.0000 ug | PREFILLED_SYRINGE | INTRAMUSCULAR | Status: DC | PRN

## 2021-03-21 MED ORDER — ONDANSETRON HCL 4 MG/2ML IJ SOLN
4.0000 mg | Freq: Once | INTRAMUSCULAR | Status: AC
  Administered 2021-03-21: 4 mg via INTRAVENOUS
  Filled 2021-03-21: qty 2

## 2021-03-21 MED ORDER — MORPHINE SULFATE (PF) 4 MG/ML IV SOLN
4.0000 mg | INTRAVENOUS | Status: DC | PRN
  Administered 2021-03-21 – 2021-03-23 (×5): 4 mg via INTRAVENOUS
  Filled 2021-03-21 (×5): qty 1

## 2021-03-21 MED ORDER — HYDROMORPHONE HCL 1 MG/ML IJ SOLN
1.0000 mg | Freq: Once | INTRAMUSCULAR | Status: AC
  Administered 2021-03-21: 1 mg via INTRAVENOUS
  Filled 2021-03-21: qty 1

## 2021-03-21 MED ORDER — INDOMETHACIN 50 MG RE SUPP
RECTAL | Status: AC
  Filled 2021-03-21: qty 2

## 2021-03-21 MED ORDER — BUPIVACAINE-EPINEPHRINE 0.25% -1:200000 IJ SOLN
INTRAMUSCULAR | Status: DC | PRN
  Administered 2021-03-21: 20 mL

## 2021-03-21 MED ORDER — ENOXAPARIN SODIUM 60 MG/0.6ML IJ SOSY: 0.5000 mg/kg | PREFILLED_SYRINGE | INTRAMUSCULAR | Status: DC

## 2021-03-21 MED ORDER — ACETAMINOPHEN 650 MG RE SUPP: 650.0000 mg | Freq: Four times a day (QID) | RECTAL | Status: DC | PRN

## 2021-03-21 MED ORDER — POLYETHYLENE GLYCOL 3350 17 G PO PACK
17.0000 g | PACK | Freq: Every day | ORAL | Status: DC | PRN
  Administered 2021-03-22: 17 g via ORAL
  Filled 2021-03-21: qty 1

## 2021-03-21 MED ORDER — OXYCODONE HCL 5 MG PO TABS
ORAL_TABLET | ORAL | Status: AC
  Filled 2021-03-21: qty 1

## 2021-03-21 MED ORDER — ONDANSETRON HCL 4 MG/2ML IJ SOLN
INTRAMUSCULAR | Status: DC | PRN
  Administered 2021-03-21: 4 mg via INTRAVENOUS

## 2021-03-21 MED ORDER — ACETAMINOPHEN 10 MG/ML IV SOLN
INTRAVENOUS | Status: DC | PRN
  Administered 2021-03-21: 1000 mg via INTRAVENOUS

## 2021-03-21 MED ORDER — ROCURONIUM BROMIDE 100 MG/10ML IV SOLN
INTRAVENOUS | Status: DC | PRN
Start: 2021-03-21 — End: 2021-03-21
  Administered 2021-03-21 (×2): 20 mg via INTRAVENOUS
  Administered 2021-03-21: 50 mg via INTRAVENOUS
  Administered 2021-03-21: 20 mg via INTRAVENOUS
  Administered 2021-03-21: 10 mg via INTRAVENOUS

## 2021-03-21 MED ORDER — ONDANSETRON HCL 4 MG PO TABS: 4.0000 mg | ORAL_TABLET | Freq: Four times a day (QID) | ORAL | Status: DC | PRN

## 2021-03-21 MED ORDER — SUCCINYLCHOLINE CHLORIDE 200 MG/10ML IV SOSY
PREFILLED_SYRINGE | INTRAVENOUS | Status: DC | PRN
  Administered 2021-03-21: 120 mg via INTRAVENOUS

## 2021-03-21 MED ORDER — LEVOTHYROXINE SODIUM 175 MCG PO TABS
175.0000 ug | ORAL_TABLET | Freq: Every day | ORAL | Status: DC
  Administered 2021-03-22: 175 ug via ORAL
  Filled 2021-03-21 (×2): qty 1

## 2021-03-21 MED ORDER — OXYCODONE HCL 5 MG/5ML PO SOLN: 5.0000 mg | Freq: Once | ORAL | Status: AC | PRN

## 2021-03-21 MED ORDER — PHENYLEPHRINE 40 MCG/ML (10ML) SYRINGE FOR IV PUSH (FOR BLOOD PRESSURE SUPPORT)
PREFILLED_SYRINGE | INTRAVENOUS | Status: DC | PRN
  Administered 2021-03-21: 80 ug via INTRAVENOUS

## 2021-03-21 MED ORDER — SODIUM CHLORIDE 0.9 % IV SOLN: INTRAVENOUS | Status: DC

## 2021-03-21 MED ORDER — LIDOCAINE HCL (CARDIAC) PF 100 MG/5ML IV SOSY
PREFILLED_SYRINGE | INTRAVENOUS | Status: DC | PRN
  Administered 2021-03-21: 100 mg via INTRAVENOUS

## 2021-03-21 MED ORDER — MIDAZOLAM HCL 2 MG/2ML IJ SOLN
INTRAMUSCULAR | Status: DC | PRN
  Administered 2021-03-21: 2 mg via INTRAVENOUS

## 2021-03-21 MED ORDER — ONDANSETRON HCL 4 MG/2ML IJ SOLN: 4.0000 mg | Freq: Once | INTRAMUSCULAR | Status: DC | PRN

## 2021-03-21 MED ORDER — LORAZEPAM 2 MG/ML IJ SOLN
1.0000 mg | Freq: Four times a day (QID) | INTRAMUSCULAR | Status: DC | PRN
  Administered 2021-03-22: 1 mg via INTRAVENOUS
  Filled 2021-03-21: qty 1

## 2021-03-21 MED ORDER — PROMETHAZINE HCL 25 MG/ML IJ SOLN: 6.2500 mg | INTRAMUSCULAR | Status: DC | PRN

## 2021-03-21 MED ORDER — HYDROMORPHONE HCL 1 MG/ML IJ SOLN
0.5000 mg | INTRAMUSCULAR | Status: DC | PRN
  Administered 2021-03-21: 0.5 mg via INTRAVENOUS

## 2021-03-21 MED ORDER — CEFAZOLIN SODIUM-DEXTROSE 2-3 GM-%(50ML) IV SOLR
INTRAVENOUS | Status: DC | PRN
Start: 2021-03-21 — End: 2021-03-21
  Administered 2021-03-21: 2 g via INTRAVENOUS

## 2021-03-21 MED ORDER — 0.9 % SODIUM CHLORIDE (POUR BTL) OPTIME
TOPICAL | Status: DC | PRN
  Administered 2021-03-21: 200 mL

## 2021-03-21 MED ORDER — ACETAMINOPHEN 10 MG/ML IV SOLN
INTRAVENOUS | Status: AC
  Filled 2021-03-21: qty 100

## 2021-03-21 SURGICAL SUPPLY — 56 items
BAG INFUSER PRESSURE 100CC (MISCELLANEOUS) ×1 IMPLANT
BAG RETRIEVAL 10 (BASKET) ×1
BLADE SURG SZ11 CARB STEEL (BLADE) ×2 IMPLANT
BULB RESERV EVAC DRAIN JP 100C (MISCELLANEOUS) ×1 IMPLANT
CANNULA REDUC XI 12-8 STAPL (CANNULA) ×1
CANNULA REDUCER 12-8 DVNC XI (CANNULA) ×1 IMPLANT
CLIP LIGATING HEMO O LOK GREEN (MISCELLANEOUS) ×2 IMPLANT
DERMABOND ADVANCED (GAUZE/BANDAGES/DRESSINGS) ×1
DERMABOND ADVANCED .7 DNX12 (GAUZE/BANDAGES/DRESSINGS) ×1 IMPLANT
DRAIN CHANNEL JP 19F (MISCELLANEOUS) ×1 IMPLANT
DRAPE ARM DVNC X/XI (DISPOSABLE) ×4 IMPLANT
DRAPE COLUMN DVNC XI (DISPOSABLE) ×1 IMPLANT
DRAPE DA VINCI XI ARM (DISPOSABLE) ×4
DRAPE DA VINCI XI COLUMN (DISPOSABLE) ×1
DRSG TEGADERM 4X4.75 (GAUZE/BANDAGES/DRESSINGS) ×1 IMPLANT
ELECT REM PT RETURN 9FT ADLT (ELECTROSURGICAL) ×2
ELECTRODE REM PT RTRN 9FT ADLT (ELECTROSURGICAL) ×1 IMPLANT
GLOVE SURG ENC MOIS LTX SZ6.5 (GLOVE) ×4 IMPLANT
GLOVE SURG UNDER POLY LF SZ6.5 (GLOVE) ×4 IMPLANT
GOWN STRL REUS W/ TWL LRG LVL3 (GOWN DISPOSABLE) ×3 IMPLANT
GOWN STRL REUS W/TWL LRG LVL3 (GOWN DISPOSABLE) ×3
GRASPER SUT TROCAR 14GX15 (MISCELLANEOUS) ×2 IMPLANT
IRRIGATOR SUCT 8 DISP DVNC XI (IRRIGATION / IRRIGATOR) IMPLANT
IRRIGATOR SUCTION 8MM XI DISP (IRRIGATION / IRRIGATOR) ×1
IV NS 1000ML (IV SOLUTION) ×1
IV NS 1000ML BAXH (IV SOLUTION) IMPLANT
KIT PINK PAD W/HEAD ARE REST (MISCELLANEOUS) ×2 IMPLANT
KIT PINK PAD W/HEAD ARM REST (MISCELLANEOUS) ×1 IMPLANT
LABEL OR SOLS (LABEL) ×2 IMPLANT
MANIFOLD NEPTUNE II (INSTRUMENTS) ×2 IMPLANT
NDL INSUFFLATION 14GA 120MM (NEEDLE) ×1 IMPLANT
NEEDLE HYPO 22GX1.5 SAFETY (NEEDLE) ×2 IMPLANT
NEEDLE INSUFFLATION 14GA 120MM (NEEDLE) ×2 IMPLANT
NS IRRIG 500ML POUR BTL (IV SOLUTION) ×2 IMPLANT
OBTURATOR OPTICAL STANDARD 8MM (TROCAR) ×1
OBTURATOR OPTICAL STND 8 DVNC (TROCAR) ×1
OBTURATOR OPTICALSTD 8 DVNC (TROCAR) ×1 IMPLANT
PACK LAP CHOLECYSTECTOMY (MISCELLANEOUS) ×2 IMPLANT
POUCH SPECIMEN RETRIEVAL 10MM (ENDOMECHANICALS) ×2 IMPLANT
SEAL CANN UNIV 5-8 DVNC XI (MISCELLANEOUS) ×3 IMPLANT
SEAL XI 5MM-8MM UNIVERSAL (MISCELLANEOUS) ×3
SET TUBE SMOKE EVAC HIGH FLOW (TUBING) ×2 IMPLANT
SOLUTION ELECTROLUBE (MISCELLANEOUS) ×2 IMPLANT
SPIKE FLUID TRANSFER (MISCELLANEOUS) ×4 IMPLANT
SPONGE DRAIN TRACH 4X4 STRL 2S (GAUZE/BANDAGES/DRESSINGS) ×1 IMPLANT
SPONGE T-LAP 4X18 ~~LOC~~+RFID (SPONGE) IMPLANT
STAPLER CANNULA SEAL DVNC XI (STAPLE) ×1 IMPLANT
STAPLER CANNULA SEAL XI (STAPLE) ×1
SUT DVC VLOC 3-0 CL 6 P-12 (SUTURE) ×1 IMPLANT
SUT MNCRL 4-0 (SUTURE) ×1
SUT MNCRL 4-0 27XMFL (SUTURE) ×1
SUT VICRYL 0 AB UR-6 (SUTURE) ×2 IMPLANT
SUTURE MNCRL 4-0 27XMF (SUTURE) ×1 IMPLANT
SYS BAG RETRIEVAL 10MM (BASKET) ×1
SYSTEM BAG RETRIEVAL 10MM (BASKET) IMPLANT
WATER STERILE IRR 500ML POUR (IV SOLUTION) ×2 IMPLANT

## 2021-03-21 NOTE — Transfer of Care (Signed)
Immediate Anesthesia Transfer of Care Note  Patient: Veola Cafaro Jalbert  Procedure(s) Performed: XI ROBOTIC ASSISTED LAPAROSCOPIC CHOLECYSTECTOMY (Abdomen) INDOCYANINE GREEN FLUORESCENCE IMAGING (ICG) (Abdomen)  Patient Location: PACU  Anesthesia Type:General  Level of Consciousness: sedated and patient cooperative  Airway & Oxygen Therapy: Patient Spontanous Breathing and Patient connected to face mask oxygen  Post-op Assessment: Report given to RN and Post -op Vital signs reviewed and stable  Post vital signs: Reviewed and stable  Last Vitals:  Vitals Value Taken Time  BP 150/93 03/21/21 1936  Temp 36.7 C 03/21/21 1936  Pulse 100 03/21/21 1941  Resp 22 03/21/21 1941  SpO2 95 % 03/21/21 1941  Vitals shown include unvalidated device data.  Last Pain:  Vitals:   03/21/21 1936  TempSrc:   PainSc: Asleep         Complications: No notable events documented.

## 2021-03-21 NOTE — Consult Note (Signed)
SURGICAL CONSULTATION NOTE   HISTORY OF PRESENT ILLNESS (HPI):  46 y.o. female presented to [facility] ED for evaluation of abdominal pain since Saturday. Patient reports started having epigastric pain on Saturday.  The pain has slowly progressed to is localized to the right upper quadrant.  Pain radiates to her back.  Pain aggravated by applying pressure. there has been no alleviating factors.  At the ED she was found with mild leukocytosis.  Bilirubin, alkaline phosphatase and liver enzymes within normal limits.  She has an ultrasound that shows gallbladder wall thickening and positive Murphy sign with gallstones.  This is suggestive of cholecystitis.  I personally evaluated the images.  Upon evaluation of her chart I was able to evaluate an MRCP on April 2017 that showed that patient today had choledocholithiasis.  As per patient nothing was done because it was not causing her problems.  During the ultrasound today shows mildly dilated common bile duct.  I recommended to proceed with MRCP.  MRCP strongly suggest choledocholithiasis.  I personally evaluate the images.  Surgery is consulted by Dr. Tamala Julian in this context for evaluation and management of acute cholecystitis.  PAST MEDICAL HISTORY (PMH):  Past Medical History:  Diagnosis Date   Anxiety and depression    Diabetes mellitus without complication (HCC)    Gallstone    PCOS (polycystic ovarian syndrome)      PAST SURGICAL HISTORY ([place]):  Past Surgical History:  Procedure Laterality Date   no surgery history       MEDICATIONS:  Prior to Admission medications   Medication Sig Start Date End Date Taking? Authorizing Provider  ALPRAZolam Duanne Moron) 0.5 MG tablet Take 0.5 mg by mouth 2 (two) times daily. 03/25/18   [provider]  Desogestrel-Ethinyl Estradiol (ISIBLOOM PO) Take by mouth.    [provider]  Ferrous Sulfate (IRON PO) Take by mouth once a week.    [provider]  levothyroxine (SYNTHROID) 175  MCG tablet Take 175 mcg by mouth daily. 06/05/18   [provider]  MAGNESIUM PO Take by mouth.    [provider]  metFORMIN (GLUCOPHAGE-XR) 750 MG 24 hr tablet Take 750 mg by mouth daily with breakfast. Patient not taking: Reported on 03/17/2020    [provider]  OZEMPIC, 1 MG/DOSE, 4 MG/3ML SOPN Inject 1 mg as directed once a week. 12/30/19   [provider]  phentermine (ADIPEX-P) 37.5 MG tablet Take 56.25 mg by mouth daily. 02/16/20   [provider]  TRINTELLIX 10 MG TABS tablet Take 10 mg by mouth daily. 02/16/20   [provider]  Vitamin D, Ergocalciferol, (DRISDOL) 1.25 MG (50000 UT) CAPS capsule Take 50,000 Units by mouth once a week. 04/24/18   [provider]     ALLERGIES:  No Known Allergies   SOCIAL HISTORY:  Social History   Socioeconomic History   Marital status: Married    Spouse name: Not on file   Number of children: Not on file   Years of education: Not on file   Highest education level: Not on file  Occupational History   Not on file  Tobacco Use   Smoking status: Every Day    Packs/day: 0.30    Types: Cigarettes   Smokeless tobacco: Never  Vaping Use   Vaping Use: Some days  Substance and Sexual Activity   Alcohol use: Yes   Drug use: Not Currently   Sexual activity: Yes    Birth control/protection: None  Other Topics Concern  Not on file  Social History Narrative   Not on file   Social Determinants of Health   Financial Resource Strain: Not on file  Food Insecurity: Not on file  Transportation Needs: Not on file  Physical Activity: Not on file  Stress: Not on file  Social Connections: Not on file  Intimate Partner Violence: Not on file      FAMILY HISTORY:  Family History  Problem Relation Age of Onset   COPD Mother    Heart disease Mother    Rheum arthritis Maternal Grandmother    Asthma Daughter    Eczema Daughter      REVIEW OF SYSTEMS:  Constitutional: denies weight  loss, fever, chills, or sweats  Eyes: denies any other vision changes, history of eye injury  ENT: denies sore throat, hearing problems  Respiratory: denies shortness of breath, wheezing  Cardiovascular: denies chest pain, palpitations  Gastrointestinal: abdominal pain, nausea and vomiting Genitourinary: denies burning with urination or urinary frequency Musculoskeletal: denies any other joint pains or cramps  Skin: denies any other rashes or skin discolorations  Neurological: denies any other headache, dizziness, weakness  Psychiatric: denies any other depression, anxiety   All other review of systems were negative   VITAL SIGNS:  Temp:  [98.7 F (37.1 C)-98.8 F (37.1 C)] 98.7 F (37.1 C) (02/20 2350) Pulse Rate:  [86-97] 87 (02/21 0515) Resp:  [16-20] 16 (02/21 0515) BP: (157-182)/(94-111) 157/94 (02/21 0515) SpO2:  [95 %-100 %] 95 % (02/21 0515) Weight:  [99.8 kg] 99.8 kg (02/20 2027)     Height: 5\' 2"  (157.5 cm) Weight: 99.8 kg BMI (Calculated): 40.23   INTAKE/OUTPUT:  This shift: No intake/output data recorded.  Last 2 shifts: @IOLAST2SHIFTS @   PHYSICAL EXAM:  Constitutional:  -- Normal body habitus  -- Awake, alert, and oriented x3  Eyes:  -- Pupils equally round and reactive to light  -- No scleral icterus  Ear, nose, and throat:  -- No jugular venous distension  Pulmonary:  -- No crackles  -- Equal breath sounds bilaterally -- Breathing non-labored at rest Cardiovascular:  -- S1, S2 present  -- No pericardial rubs Gastrointestinal:  -- Abdomen soft, tender to palpation in right upper quadrant, non-distended, no guarding or rebound tenderness -- No abdominal masses appreciated, pulsatile or otherwise  Musculoskeletal and Integumentary:  -- Wounds: None appreciated -- Extremities: B/L UE and LE FROM, hands and feet warm, no edema  Neurologic:  -- Motor function: intact and symmetric -- Sensation: intact and symmetric   Labs:  CBC Latest Ref Rng & Units  03/20/2021  WBC 4.0 - 10.5 K/uL 11.9(H)  Hemoglobin 12.0 - 15.0 g/dL 12.8  Hematocrit 36.0 - 46.0 % 40.2  Platelets 150 - 400 K/uL 411(H)   CMP Latest Ref Rng & Units 03/20/2021  Glucose 70 - 99 mg/dL 115(H)  BUN 6 - 20 mg/dL 8  Creatinine 0.44 - 1.00 mg/dL 0.57  Sodium 135 - 145 mmol/L 134(L)  Potassium 3.5 - 5.1 mmol/L 3.9  Chloride 98 - 111 mmol/L 99  CO2 22 - 32 mmol/L 26  Calcium 8.9 - 10.3 mg/dL 8.8(L)  Total Protein 6.5 - 8.1 g/dL 7.5  Total Bilirubin 0.3 - 1.2 mg/dL 0.6  Alkaline Phos 38 - 126 U/L 54  AST 15 - 41 U/L 23  ALT 0 - 44 U/L 18    Imaging studies:  EXAM: MRI ABDOMEN WITHOUT AND WITH CONTRAST (INCLUDING MRCP)   TECHNIQUE: Multiplanar multisequence MR imaging of the abdomen was performed  both before and after the administration of intravenous contrast. Heavily T2-weighted images of the biliary and pancreatic ducts were obtained, and three-dimensional MRCP images were rendered by post processing.   CONTRAST:  63mL GADAVIST GADOBUTROL 1 MMOL/ML IV SOLN   COMPARISON:  Abdominal MRI 05/26/2015.   FINDINGS: Lower chest: Unremarkable.   Hepatobiliary: Mild diffuse loss of signal intensity throughout the hepatic parenchyma on out of phase dual echo images, indicative of a background of mild hepatic steatosis (improved compared to the prior study). No suspicious cystic or solid hepatic lesions. Very mild intra and extrahepatic biliary ductal dilatation is noted on MRCP images. Common bile duct measures up to 7 mm in the porta hepatis. There is abrupt termination of the distal common bile duct immediately above the level of the ampulla, best appreciated on coronal image 16 of series 3, where there is a strong suggestion of a distal ductal stone, which is difficult to corroborate on additional pulse sequences, but suspected on several, including MRCP image 3 of series 16. There are multiple small filling defects lying dependently within the gallbladder,  indicative of gallstones. Gallbladder is only moderately distended. Gallbladder wall does not appear thickened. There is a trace volume of pericholecystic fluid, however.   Pancreas: No pancreatic mass. No pancreatic ductal dilatation. No pancreatic or peripancreatic fluid collections or inflammatory changes.   Spleen:  Unremarkable.   Adrenals/Urinary Tract: Subcentimeter T1 hypointense, T2 hyperintense, nonenhancing lesion in the medial aspect of the upper pole of the right kidney, compatible with a small simple cyst. Left kidney and bilateral adrenal glands are normal in appearance. No hydroureteronephrosis in the visualized portions of the abdomen.   Stomach/Bowel: Visualized portions are unremarkable.   Vascular/Lymphatic: No aneurysm identified in the visualized abdominal vasculature. No lymphadenopathy noted in the abdomen.   Other: No significant volume of ascites noted in the visualized portions of the peritoneal cavity.   Musculoskeletal: No aggressive appearing osseous lesions are noted in the visualized portions of the skeleton.   IMPRESSION: 1. Study is positive for cholelithiasis. Gallbladder is only moderately distended, however, there is pericholecystic fluid and soft tissue stranding, suggesting acute inflammation. 2. Very mild intra and extrahepatic biliary ductal dilatation with strong suggestion of choledocholithiasis in the very distal aspect of the common bile duct immediately before the ampulla. If there is clinical evidence of biliary tract obstruction, correlation with ERCP should be considered. 3. Mild hepatic steatosis.     Electronically Signed   By: Vinnie Langton M.D.   On: 03/21/2021 05:53  Assessment/Plan:  46 y.o. female with choledocholithiasis and cholecystitis, complicated by pertinent comorbidities including, diabetes mellitus, anxiety, hypothyroidism morbid obesity.  Patient with right upper quadrant pain.  Clinical exam and  imaging consistent with cholecystitis.  Also imaging suggestive of choledocholithiasis.  Patient has history of choledocholithiasis in the past.  Even though the bilirubin is within normal limits and there is a filling defect in the distal common bile duct.  I recommend hospitalist and consult to gastroenterology for evaluation for ERCP.  If ERCP is done patient will need intraoperative cholangiogram.  I will follow closely for further recommendation after gastroenterology evaluation.   Arnold Long, MD

## 2021-03-21 NOTE — ED Provider Notes (Signed)
[facility] Provider Note    Event Date/Time   First MD Initiated Contact with Patient 03/21/21 0105     (approximate)   History   Abdominal Pain   HPI  Beth Savage is a 46 y.o. female with a past medical history of PCOS, DM, anxiety, depression and hypothyroidism with known gallstones who presents for evaluation of acute onset of persistent right upper quad abdominal pain associate with nonbloody nonbilious vomiting diarrhea ongoing over the last 3 days.  Patient states she has never had pain like this before.  She denies any fevers, chest pain, cough, shortness of breath, earache, sore throat, rash or extremity pain.  No urinary symptoms.  No medications for this prior to arrival.    Past Medical History:  Diagnosis Date   Anxiety and depression    Diabetes mellitus without complication ([place])    Gallstone    PCOS (polycystic ovarian syndrome)      Physical Exam  Triage Vital Signs: ED Triage Vitals  Enc Vitals Group     BP 03/20/21 2026 (!) 182/94     Pulse Rate 03/20/21 2026 97     Resp 03/20/21 2026 18     Temp 03/20/21 2026 98.8 F (37.1 C)     Temp Source 03/20/21 2026 Oral     SpO2 03/20/21 2026 100 %     Weight 03/20/21 2027 220 lb (99.8 kg)     Height 03/20/21 2027 5' 2" (1.575 m)     Head Circumference --      Peak Flow --      Pain Score 03/20/21 2026 9     Pain Loc --      Pain Edu? --      Excl. in [place]? --     Most recent vital signs: Vitals:   03/21/21 0415 03/21/21 0515  BP: (!) 177/111 (!) 157/94  Pulse: 93 87  Resp: 20 16  Temp:    SpO2: 97% 95%    General: Awake, appears very uncomfortable. CV:  2+ radial pulses. Resp:  Normal effort.  Abd:  No distention.  Very tender in the right upper quadrant.  No CVA tenderness.    ED Results / Procedures / Treatments  Labs (all labs ordered are listed, but only abnormal results are displayed) Labs Reviewed  COMPREHENSIVE METABOLIC PANEL - Abnormal; Notable  for the following components:      Result Value   Sodium 134 (*)    Glucose, Bld 115 (*)    Calcium 8.8 (*)    All other components within normal limits  CBC WITH DIFFERENTIAL/PLATELET - Abnormal; Notable for the following components:   WBC 11.9 (*)    Platelets 411 (*)    Neutro Abs 8.3 (*)    All other components within normal limits  URINALYSIS, ROUTINE W REFLEX MICROSCOPIC - Abnormal; Notable for the following components:   Color, Urine YELLOW (*)    APPearance CLEAR (*)    All other components within normal limits  RESP PANEL BY RT-PCR (FLU A&B, COVID) ARPGX2  LIPASE, BLOOD  POC URINE PREG, ED     EKG  EKG is remarkable for sinus rhythm with a ventricular rate of 79, normal axis, unremarkable intervals without evidence of acute ischemia significant arrhythmia   RADIOLOGY Right upper quadrant ultrasound interpreted by myself shows some gallstones without significant surrounding edema.  As reviewed radiologist interpretation and agree with her findings of gallstones without wall thickening or pericholecystic fluid.  Common bile duct is dilated at 9 mm and there is evidence of hepatic steatosis without other acute process noted by radiology.  MRCP on my interpretation does show common bile duct slightly dilated and evidence of cholecystitis.  I also reviewed radiology's findings and agree with the findings of evidence of cholelithiasis with distended gallbladder and some pericholecystic fluid as well as stranding concerning for acute cholecystitis.  There is also notation of some intra and extrahepatic bili duct dilation suggesting choledocholithiasis as well as hepatic asteatosis.   PROCEDURES:  Critical Care performed: No  Procedures   MEDICATIONS ORDERED IN ED: Medications  piperacillin-tazobactam (ZOSYN) IVPB 3.375 g (3.375 g Intravenous New Bag/Given 03/21/21 7654)  oxyCODONE-acetaminophen (PERCOCET/ROXICET) 5-325 MG per tablet 1 tablet (1 tablet Oral Given 03/20/21  2143)  HYDROmorphone (DILAUDID) injection 1 mg (1 mg Intravenous Given 03/21/21 0124)  ondansetron (ZOFRAN) injection 4 mg (4 mg Intravenous Given 03/21/21 0123)  lactated ringers bolus 1,000 mL (0 mLs Intravenous Stopped 03/21/21 0500)  gadobutrol (GADAVIST) 1 MMOL/ML injection 10 mL (10 mLs Intravenous Contrast Given 03/21/21 0211)  HYDROmorphone (DILAUDID) injection 0.5 mg (0.5 mg Intravenous Given 03/21/21 0424)  HYDROmorphone (DILAUDID) injection 0.5 mg (0.5 mg Intravenous Given 03/21/21 0710)     IMPRESSION / MDM / ASSESSMENT AND PLAN / ED COURSE  I reviewed the triage vital signs and the nursing notes.                              Differential diagnosis includes, but is not limited to acute cholecystitis, symptomatic cholelithiasis, cholangitis, pancreatitis, gastritis, kidney stone, diverticulitis and cystitis.  UA not suggestive of cystitis.  CBC shows WBC count of 11.9 without evidence of acute anemia.  CMP without significant electrolyte or metabolic derangements.  Lipase not suggestive of pancreatitis.  COVID influenza PCR is negative.  Pregnancy test was negative.  Right upper quadrant ultrasound interpreted by myself shows some gallstones without significant surrounding edema.  As reviewed radiologist interpretation and agree with her findings of gallstones without wall thickening or pericholecystic fluid.  Common bile duct is dilated at 9 mm and there is evidence of hepatic steatosis without other acute process noted by radiology.  While, bile duct amateur is slightly above the upper limit of normal there is no evidence of elevated bilirubin or alk phos on CMP which is not suggestive of an acute cholestatic process.  Overall clinical picture at this point is most consistent with acute symptomatic cholecystitis.  I discussed concerns with on-call  General surgery Dr. Peyton Najjar who will evaluate patient.  MRCP on my interpretation does show common bile duct slightly dilated and evidence  of cholecystitis.  I also reviewed radiology's findings and agree with the findings of evidence of cholelithiasis with distended gallbladder and some pericholecystic fluid as well as stranding concerning for acute cholecystitis.  There is also notation of some intra and extrahepatic bili duct dilation suggesting choledocholithiasis as well as hepatic asteatosis.  I also discussed with on-call GI physician Dr. Virgina Jock and after discussion between Dr. Virgina Jock and Cintron recommendation is to admit to hospitalist service.  Patient will need a cholecystectomy but will need discussion with daytime GI to determine if she will be a con commitment ASAP at that time.  Will start on antibiotics.  Patient will be admitted to medicine service.  At this time I do not believe patient is septic and I believe the patient for ascending cholangitis.  FINAL CLINICAL IMPRESSION(S) / ED DIAGNOSES   Final diagnoses:  RUQ pain  Cholecystitis     Rx / DC Orders   ED Discharge Orders     None        Note:  This document was prepared using Dragon voice recognition software and may include unintentional dictation errors.   Lucrezia Starch, MD 03/21/21 [id number]

## 2021-03-21 NOTE — ED Notes (Addendum)
RN here to transport pt for ERCP then surgery. Pt transported via wheelchair. Signed consent & pt labels given to RN.

## 2021-03-21 NOTE — ED Notes (Signed)
Pt will brought to endoscopy for ERCP after lunch today. Keep NPO and no blood thinners to be given.

## 2021-03-21 NOTE — Anesthesia Procedure Notes (Signed)
Procedure Name: Intubation Date/Time: 03/21/2021 4:00 PM Performed by: Nelda Marseille, CRNA Pre-anesthesia Checklist: Patient identified, Patient being monitored, Timeout performed, Emergency Drugs available and Suction available Patient Re-evaluated:Patient Re-evaluated prior to induction Oxygen Delivery Method: Circle system utilized Preoxygenation: Pre-oxygenation with 100% oxygen Induction Type: IV induction Ventilation: Mask ventilation without difficulty Laryngoscope Size: Mac, 3 and McGraph Grade View: Grade I Tube type: Oral Tube size: 7.0 mm Number of attempts: 1 Airway Equipment and Method: Stylet Placement Confirmation: ETT inserted through vocal cords under direct vision, positive ETCO2 and breath sounds checked- equal and bilateral Secured at: 21 cm Tube secured with: Tape Dental Injury: Teeth and Oropharynx as per pre-operative assessment

## 2021-03-21 NOTE — ED Notes (Signed)
Pt given lemon swabs to moisten mouth

## 2021-03-21 NOTE — Transfer of Care (Signed)
Immediate Anesthesia Transfer of Care Note  Patient: Beth Savage  Procedure(s) Performed: ENDOSCOPIC RETROGRADE CHOLANGIOPANCREATOGRAPHY (ERCP)  Patient Location: OR  Anesthesia Type:General  Level of Consciousness: Patient remains intubated per anesthesia plan  Airway & Oxygen Therapy: Patient remains intubated per anesthesia plan  Post-op Assessment: Report given to RN and Post -op Vital signs reviewed and stable  Post vital signs: Reviewed and stable  Last Vitals:  Vitals Value Taken Time  BP    Temp    Pulse    Resp    SpO2      Last Pain:  Vitals:   03/21/21 1348  TempSrc: Temporal  PainSc: 0-No pain         Complications: No notable events documented.

## 2021-03-21 NOTE — Op Note (Signed)
Preoperative diagnosis: Acute cholecystitis with choledocholithiasis  Postoperative diagnosis: Same  Procedure: Robotic Assisted Laparoscopic Cholecystectomy.                      Robotic Assisted Expoloration of cystic duct for removal of impacted stone   Anesthesia: GETA   Surgeon: Dr. Windell Moment  Wound Classification: Clean Contaminated  Indications: Patient is a 46 y.o. female developed right upper quadrant pain, nausea and leukocytosis and on workup was found to have cholelithiasis with a dilated common duct with impatcted stone in cystic duct and cholecystitis and choledocholithiasis. After ERCP she was take to OR for robotic assisted laparoscopic cholecystectomy was elected.  Findings: Critical view of safety achieved Dilated Cystic duct unable to use a clip to ligate the cystic duct Adequate hemostasis                   Description of procedure: The patient was placed on the operating table in the supine position. General anesthesia was induced. A time-out was completed verifying correct patient, procedure, site, positioning, and implant(s) and/or special equipment prior to beginning this procedure. An orogastric tube was placed. The abdomen was prepped and draped in the usual sterile fashion.  An incision was made in a natural skin line below the umbilicus.  The fascia was elevated and the Veress needle inserted. Proper position was confirmed by aspiration and saline meniscus test.  The abdomen was insufflated with carbon dioxide to a pressure of 15 mmHg. The patient tolerated insufflation well. A 8-mm trocar was then inserted in optiview fashion.  The laparoscope was inserted and the abdomen inspected. No injuries from initial trocar placement were noted. Additional trocars were then inserted in the following locations: an 8-mm trocar in the left lateral abdomen, and another two 8-mm trocars to the right side of the abdomen 5 cm appart. The umbilical trocar was  changed to a 12 mm trocar all under direct visualization. The abdomen was inspected and no abnormalities were found. The table was placed in the reverse Trendelenburg position with the right side up. The robotic arms were docked and target anatomy identified. Instrument inserted under direct visualization.  Filmy adhesions between the gallbladder and omentum, duodenum and transverse colon were lysed with electrocautery. Firefly was used to identify biliary structures. No ICG green identified in gallbladder consistent with cholecystitis. The dome of the gallbladder was grasped with a prograsp and retracted over the dome of the liver. The infundibulum was also grasped with an atraumatic grasper and retracted toward the right lower quadrant. This maneuver exposed Calots triangle. The peritoneum overlying the gallbladder infundibulum was then incised and the cystic duct and cystic artery identified and circumferentially dissected. Critical view of safety reviewed before cutting any structure. The cystic duct was dilated that the gallbladder was needed to be cut on the infundibulum. Multiple stones were retrieved from the cystic duct. There was a large stone impacted on the cystic duct so the cystic duct was explored opening the cystic duct down to the stone. The stone was retrieved. This was a very difficult and time consuming maneuver of the surgery. The small cystic duct stump was closed with 3-0 Vloc suture. Firefly images taken to visualize biliary ducts. The cystic artery was then doubly clipped and divided close to the gallbladder.  The gallbladder was then dissected from its peritoneal attachments by electrocautery. Hemostasis was checked and the gallbladder and contained stones were removed using an endoscopic retrieval bag. The gallbladder was passed  off the table as a specimen. The gallbladder fossa was copiously irrigated with saline and hemostasis was obtained. There was no evidence of bleeding from the  gallbladder fossa or cystic artery or leakage of the bile from the cystic duct stump. A 19 Fr drain was left in the liver bed. Secondary trocars were removed under direct vision. No bleeding was noted. The robotic arms were undoked. The scope was withdrawn and the umbilical trocar removed. The abdomen was allowed to collapse. The fascia of the 31mm trocar sites was closed with figure-of-eight 0 vicryl sutures. The skin was closed with subcuticular sutures of 4-0 monocryl and topical skin adhesive. The orogastric tube was removed.  The patient tolerated the procedure well and was taken to the postanesthesia care unit in stable condition.   Specimen: Gallbladder  Complications: None  EBL: 50 mL

## 2021-03-21 NOTE — Anesthesia Preprocedure Evaluation (Signed)
Anesthesia Evaluation  Patient identified by MRN, date of birth, ID band Patient awake    Reviewed: Allergy & Precautions, H&P , NPO status , Patient's Chart, lab work & pertinent test results, reviewed documented beta blocker date and time   History of Anesthesia Complications Negative for: history of anesthetic complications  Airway Mallampati: II  TM Distance: >3 FB Neck ROM: full    Dental  (+) Dental Advidsory Given, Caps, Chipped   Pulmonary neg shortness of breath, neg sleep apnea, neg COPD, neg recent URI, Current Smoker and Patient abstained from smoking.,    Pulmonary exam normal breath sounds clear to auscultation       Cardiovascular Exercise Tolerance: Good negative cardio ROS Normal cardiovascular exam Rhythm:regular Rate:Normal     Neuro/Psych PSYCHIATRIC DISORDERS Anxiety Depression negative neurological ROS     GI/Hepatic negative GI ROS, (+) neg Cirrhosis      , NAFLD   Endo/Other  diabetesHypothyroidism Morbid obesity  Renal/GU negative Renal ROS  negative genitourinary   Musculoskeletal   Abdominal   Peds  Hematology negative hematology ROS (+)   Anesthesia Other Findings Past Medical History: No date: Anxiety and depression No date: Diabetes mellitus without complication (HCC) No date: Gallstone No date: PCOS (polycystic ovarian syndrome)   Reproductive/Obstetrics negative OB ROS                             Anesthesia Physical Anesthesia Plan  ASA: 3  Anesthesia Plan: General   Post-op Pain Management:    Induction: Intravenous, Rapid sequence and Cricoid pressure planned  PONV Risk Score and Plan: 2 and Ondansetron, Dexamethasone, Midazolam, Treatment may vary due to age or medical condition and Promethazine  Airway Management Planned: Oral ETT  Additional Equipment:   Intra-op Plan:   Post-operative Plan: Extubation in OR  Informed Consent: I  have reviewed the patients History and Physical, chart, labs and discussed the procedure including the risks, benefits and alternatives for the proposed anesthesia with the patient or authorized representative who has indicated his/her understanding and acceptance.     Dental Advisory Given  Plan Discussed with: Anesthesiologist, CRNA and Surgeon  Anesthesia Plan Comments:         Anesthesia Quick Evaluation

## 2021-03-21 NOTE — Op Note (Signed)
[facility] Gastroenterology Patient Name: Beth Savage Procedure Date: 03/21/2021 3:26 PM MRN: [id number] Account #: [id number] Date of Birth: Jan 23, 1976 Admit Type: Outpatient Age: 46 Room: [facility] ENDO ROOM 4 Gender: Female Note Status: Finalized Instrument Name: Selinda Eon [id number] Procedure:             ERCP Indications:           Abnormal abdominal MRI Providers:             Lucilla Lame MD, MD Medicines:             General Anesthesia Complications:         No immediate complications. Procedure:             Pre-Anesthesia Assessment:                        - Prior to the procedure, a History and Physical was                         performed, and patient medications and allergies were                         reviewed. The patient's tolerance of previous                         anesthesia was also reviewed. The risks and benefits                         of the procedure and the sedation options and risks                         were discussed with the patient. All questions were                         answered, and informed consent was obtained. Prior                         Anticoagulants: The patient has taken no previous                         anticoagulant or antiplatelet agents. ASA Grade                         Assessment: II - A patient with mild systemic disease.                         After reviewing the risks and benefits, the patient                         was deemed in satisfactory condition to undergo the                         procedure.                        After obtaining informed consent, the scope was passed                         under direct vision. Throughout the procedure, the  patient's blood pressure, pulse, and oxygen                         saturations were monitored continuously. The                         Duodenoscope was introduced through the mouth, and                         used to inject  contrast into and used to cannulate the                         bile duct. The ERCP was accomplished without                         difficulty. The patient tolerated the procedure well. Findings:      The scout film was normal. The esophagus was successfully intubated       under direct vision. The scope was advanced to a normal major papilla in       the descending duodenum without detailed examination of the pharynx,       larynx and associated structures, and upper GI tract. The upper GI tract       was grossly normal. The bile duct was deeply cannulated with the       short-nosed traction sphincterotome. Contrast was injected. I personally       interpreted the bile duct images. There was brisk flow of contrast       through the ducts. Image quality was excellent. Contrast extended to the       entire biliary tree. The lower third of the main bile duct contained       filling defect(s) thought to be sludge. A wire was passed into the       biliary tree. Biliary sphincterotomy was made with a traction (standard)       sphincterotome using ERBE electrocautery. There was no       post-sphincterotomy bleeding. The biliary tree was swept with a 15 mm       balloon starting at the bifurcation. Sludge was swept from the duct. Impression:            - The examination was suspicious for sludge.                        - A biliary sphincterotomy was performed.                        - The biliary tree was swept and sludge was found. Recommendation:        - Return patient to hospital ward for ongoing care.                        - Watch for pancreatitis, bleeding, perforation, and                         cholangitis. Procedure Code(s):     --- Professional ---                        [phone], Endoscopic retrograde cholangiopancreatography                         (  ERCP); with removal of calculi/debris from                         biliary/pancreatic duct(s)                        [phone], Endoscopic  retrograde cholangiopancreatography                         (ERCP); with sphincterotomy/papillotomy                        [phone], Endoscopic catheterization of the biliary                         ductal system, radiological supervision and                         interpretation Diagnosis Code(s):     --- Professional ---                        R93.5, Abnormal findings on diagnostic imaging of                         other abdominal regions, including retroperitoneum CPT copyright 2019 [employer]. All rights reserved. The codes documented in this report are preliminary and upon coder review may  be revised to meet current compliance requirements. Lucilla Lame MD, MD 03/21/2021 4:23:33 PM This report has been signed electronically. Number of Addenda: 0 Note Initiated On: 03/21/2021 3:26 PM Estimated Blood Loss:  Estimated blood loss: none.      [facility]

## 2021-03-21 NOTE — Anesthesia Preprocedure Evaluation (Signed)
Anesthesia Evaluation  Patient identified by MRN, date of birth, ID band Patient awake    Reviewed: Allergy & Precautions, H&P , NPO status , Patient's Chart, lab work & pertinent test results, reviewed documented beta blocker date and time   History of Anesthesia Complications Negative for: history of anesthetic complications  Airway Mallampati: II  TM Distance: >3 FB Neck ROM: full    Dental  (+) Dental Advidsory Given, Caps, Chipped   Pulmonary neg shortness of breath, neg sleep apnea, neg COPD, neg recent URI, Current Smoker and Patient abstained from smoking.,    Pulmonary exam normal breath sounds clear to auscultation       Cardiovascular Exercise Tolerance: Good negative cardio ROS Normal cardiovascular exam Rhythm:regular Rate:Normal     Neuro/Psych PSYCHIATRIC DISORDERS Anxiety Depression negative neurological ROS     GI/Hepatic negative GI ROS, (+) neg Cirrhosis      , NAFLD   Endo/Other  diabetesHypothyroidism Morbid obesity  Renal/GU negative Renal ROS  negative genitourinary   Musculoskeletal   Abdominal   Peds  Hematology negative hematology ROS (+)   Anesthesia Other Findings Past Medical History: No date: Anxiety and depression No date: Diabetes mellitus without complication (HCC) No date: Gallstone No date: PCOS (polycystic ovarian syndrome)   Reproductive/Obstetrics negative OB ROS                             Anesthesia Physical  Anesthesia Plan  ASA: 3  Anesthesia Plan: General   Post-op Pain Management:    Induction: Intravenous, Rapid sequence and Cricoid pressure planned  PONV Risk Score and Plan: 2 and Ondansetron, Dexamethasone, Midazolam, Treatment may vary due to age or medical condition and Promethazine  Airway Management Planned: Oral ETT  Additional Equipment:   Intra-op Plan:   Post-operative Plan: Extubation in OR  Informed Consent:  I have reviewed the patients History and Physical, chart, labs and discussed the procedure including the risks, benefits and alternatives for the proposed anesthesia with the patient or authorized representative who has indicated his/her understanding and acceptance.     Dental Advisory Given  Plan Discussed with: Anesthesiologist, CRNA and Surgeon  Anesthesia Plan Comments: (Plan to remain intubated for transport from ERCP to OR for cholecystectomy; discussed with patient.  Patient consented for risks of anesthesia including but not limited to:  - adverse reactions to medications - damage to eyes, teeth, lips or other oral mucosa - nerve damage due to positioning  - sore throat or hoarseness - damage to heart, brain, nerves, lungs, other parts of body or loss of life  Informed patient about role of CRNA in peri- and intra-operative care.  Patient voiced understanding.)        Anesthesia Quick Evaluation

## 2021-03-21 NOTE — Progress Notes (Signed)
Anticoagulation monitoring(Lovenox):  45yo  F ordered Lovenox 40 mg Q24h    Filed Weights   03/20/21 2027  Weight: 99.8 kg (220 lb)   BMI 40.24   Lab Results  Component Value Date   CREATININE 0.57 03/20/2021   Estimated Creatinine Clearance: 99.2 mL/min (by C-G formula based on SCr of 0.57 mg/dL). Hemoglobin & Hematocrit     Component Value Date/Time   HGB 12.8 03/20/2021 2030   HCT 40.2 03/20/2021 2030     Per Protocol for Patient with estCrcl > 30 ml/min and BMI > 30, will transition to Lovenox 0.5 mg/kg Q24h       Chinita Greenland PharmD Clinical Pharmacist 03/21/2021

## 2021-03-21 NOTE — ED Notes (Signed)
MD at bedside. 

## 2021-03-21 NOTE — ED Notes (Signed)
MRI will be ready for pt in 30 mins.

## 2021-03-21 NOTE — ED Notes (Signed)
Patient transported to MRI 

## 2021-03-21 NOTE — Consult Note (Signed)
Inpatient Consultation   Patient ID: Beth Savage is a 46 y.o. female.  Requesting Provider: Dr. Tamala Julian- EM  Date of Admission: 03/21/2021  Date of Consult: 03/21/21   Reason for Consultation:  choledocholithiasis  Patient's Chief Complaint:   Chief Complaint  Patient presents with   Abdominal Pain    46 y/o CF who presents to hospital for abdominal pain that started Saturday. Discovered to have acute cholecystitis and suspicious for choledocholithiasis on imaging. GI consulted for choledocholithiasis.  Patient noted epigastric and right upper quadrant pain on Saturday that radiates to the back.  This is aggravated by fatty and greasy foods.  No alleviating factors.  Labs performed in the emergency department demonstrated normal LFTs with only mild leukocytosis.  Ultrasound was positive for gallstones and positive Murphy sign with gallbladder wall thickening.  MRCP also supportive of this and was suspicious for choledocholithiasis.  In discussion with surgery the patient also had an MRCP in April 2017 where she also had choledocholithiasis, however, the patient denies any procedure was performed at that time as her pain resolved.  Currently still having right upper quadrant discomfort.  Nausea has improved with medication.  She denies acholic stools.  She has been dealing with constipation.  Denies any melena or hematochezia.  No hematemesis or coffee-ground emesis.  No fevers or abnormal weight loss.  Denies NSAIDs, Anti-plt agents, and anticoagulants Denies family history of gastrointestinal disease and malignancy Previous Endoscopies: none    Past Medical History:  Diagnosis Date   Anxiety and depression    Diabetes mellitus without complication ([place])    Gallstone    PCOS (polycystic ovarian syndrome)     Past Surgical History:  Procedure Laterality Date   no surgery history      No Known Allergies  Family History  Problem Relation Age of Onset   COPD Mother     Heart disease Mother    Rheum arthritis Maternal Grandmother    Asthma Daughter    Eczema Daughter     Social History   Tobacco Use   Smoking status: Every Day    Packs/day: 0.30    Types: Cigarettes   Smokeless tobacco: Never  Vaping Use   Vaping Use: Some days  Substance Use Topics   Alcohol use: Yes   Drug use: Not Currently     Pertinent GI related history and allergies were reviewed with the patient  Review of Systems  Constitutional:  Positive for appetite change and chills. Negative for activity change, diaphoresis, fatigue, fever and unexpected weight change.  HENT:  Negative for trouble swallowing and voice change.   Respiratory:  Negative for shortness of breath and wheezing.   Cardiovascular:  Negative for chest pain, palpitations and leg swelling.  Gastrointestinal:  Positive for abdominal pain (ruq) and constipation. Negative for abdominal distention, anal bleeding, blood in stool, diarrhea, nausea, rectal pain and vomiting.  Musculoskeletal:  Negative for arthralgias and myalgias.  Skin:  Negative for color change and pallor.  Neurological:  Negative for dizziness, syncope and weakness.  Psychiatric/Behavioral:  Negative for confusion.   All other systems reviewed and are negative.   Medications Home Medications No current facility-administered medications on file prior to encounter.   Current Outpatient Medications on File Prior to Encounter  Medication Sig Dispense Refill   ALPRAZolam (XANAX) 0.5 MG tablet Take 0.5 mg by mouth 2 (two) times daily.     Desogestrel-Ethinyl Estradiol (ISIBLOOM PO) Take by mouth.     Ferrous Sulfate (  IRON PO) Take by mouth once a week.     levothyroxine (SYNTHROID) 175 MCG tablet Take 175 mcg by mouth daily.     MAGNESIUM PO Take by mouth.     metFORMIN (GLUCOPHAGE-XR) 750 MG 24 hr tablet Take 750 mg by mouth daily with breakfast. (Patient not taking: Reported on 03/17/2020)     OZEMPIC, 1 MG/DOSE, 4 MG/3ML SOPN Inject 1  mg as directed once a week.     phentermine (ADIPEX-P) 37.5 MG tablet Take 56.25 mg by mouth daily.     TRINTELLIX 10 MG TABS tablet Take 10 mg by mouth daily.     Vitamin D, Ergocalciferol, (DRISDOL) 1.25 MG (50000 UT) CAPS capsule Take 50,000 Units by mouth once a week.     Pertinent GI related medications were reviewed with the patient  Inpatient Medications  Current Facility-Administered Medications:    acetaminophen (TYLENOL) tablet 650 mg, 650 mg, Oral, Q6H PRN **OR** acetaminophen (TYLENOL) suppository 650 mg, 650 mg, Rectal, Q6H PRN, Lorella Nimrod, MD   enoxaparin (LOVENOX) injection 50 mg, 0.5 mg/kg, Subcutaneous, Q24H, Amin, Sumayya, MD   HYDROmorphone (DILAUDID) injection 0.5-1 mg, 0.5-1 mg, Intravenous, Q2H PRN, Lorella Nimrod, MD   insulin aspart (novoLOG) injection 0-15 Units, 0-15 Units, Subcutaneous, Q4H, Amin, Sumayya, MD   lactated ringers infusion, , Intravenous, Continuous, Lorella Nimrod, MD   levothyroxine (SYNTHROID) tablet 175 mcg, 175 mcg, Oral, Daily, Amin, Sumayya, MD   ondansetron (ZOFRAN) tablet 4 mg, 4 mg, Oral, Q6H PRN **OR** ondansetron (ZOFRAN) injection 4 mg, 4 mg, Intravenous, Q6H PRN, Amin, Sumayya, MD   polyethylene glycol (MIRALAX / GLYCOLAX) packet 17 g, 17 g, Oral, Daily PRN, Lorella Nimrod, MD  Current Outpatient Medications:    ALPRAZolam (XANAX) 0.5 MG tablet, Take 0.5 mg by mouth 2 (two) times daily., Disp: , Rfl:    Desogestrel-Ethinyl Estradiol (ISIBLOOM PO), Take by mouth., Disp: , Rfl:    Ferrous Sulfate (IRON PO), Take by mouth once a week., Disp: , Rfl:    levothyroxine (SYNTHROID) 175 MCG tablet, Take 175 mcg by mouth daily., Disp: , Rfl:    MAGNESIUM PO, Take by mouth., Disp: , Rfl:    metFORMIN (GLUCOPHAGE-XR) 750 MG 24 hr tablet, Take 750 mg by mouth daily with breakfast. (Patient not taking: Reported on 03/17/2020), Disp: , Rfl:    OZEMPIC, 1 MG/DOSE, 4 MG/3ML SOPN, Inject 1 mg as directed once a week., Disp: , Rfl:    phentermine  (ADIPEX-P) 37.5 MG tablet, Take 56.25 mg by mouth daily., Disp: , Rfl:    TRINTELLIX 10 MG TABS tablet, Take 10 mg by mouth daily., Disp: , Rfl:    Vitamin D, Ergocalciferol, (DRISDOL) 1.25 MG (50000 UT) CAPS capsule, Take 50,000 Units by mouth once a week., Disp: , Rfl:   lactated ringers      acetaminophen **OR** acetaminophen, HYDROmorphone (DILAUDID) injection, ondansetron **OR** ondansetron (ZOFRAN) IV, polyethylene glycol   Objective   Vitals:   03/21/21 0145 03/21/21 0415 03/21/21 0515 03/21/21 0730  BP:  (!) 177/111 (!) 157/94 (!) 166/94  Pulse:  93 87 89  Resp: 16 20 16 16   Temp:      TempSrc:      SpO2:  97% 95% 100%  Weight:      Height:         Physical Exam Vitals and nursing note reviewed.  Constitutional:      General: She is in acute distress (uncomfortable appearing).     Appearance: She is obese. She is  ill-appearing. She is not toxic-appearing or diaphoretic.  HENT:     Head: Normocephalic and atraumatic.     Nose: Nose normal.     Mouth/Throat:     Mouth: Mucous membranes are moist.     Pharynx: Oropharynx is clear.     Comments: No sublingual jaundice Eyes:     General: No scleral icterus.    Extraocular Movements: Extraocular movements intact.  Cardiovascular:     Rate and Rhythm: Normal rate and regular rhythm.     Heart sounds: Normal heart sounds. No murmur heard.   No friction rub. No gallop.  Pulmonary:     Effort: Pulmonary effort is normal. No respiratory distress.     Breath sounds: Normal breath sounds. No wheezing, rhonchi or rales.  Abdominal:     General: Abdomen is flat. Bowel sounds are normal. There is no distension.     Palpations: Abdomen is soft.     Tenderness: There is abdominal tenderness (RUQ, mild). There is no guarding or rebound.     Comments: Non peritoneal. No rigidity  Musculoskeletal:     Cervical back: Neck supple.     Right lower leg: No edema.     Left lower leg: No edema.  Skin:    General: Skin is warm and  dry.     Coloration: Skin is not jaundiced or pale.  Neurological:     General: No focal deficit present.     Mental Status: She is alert and oriented to person, place, and time. Mental status is at baseline.  Psychiatric:        Mood and Affect: Mood normal.        Behavior: Behavior normal.        Thought Content: Thought content normal.        Judgment: Judgment normal.    Laboratory Data Recent Labs  Lab 03/20/21 2030  WBC 11.9*  HGB 12.8  HCT 40.2  PLT 411*   Recent Labs  Lab 03/20/21 2030  NA 134*  K 3.9  CL 99  CO2 26  BUN 8  CALCIUM 8.8*  PROT 7.5  BILITOT 0.6  ALKPHOS 54  ALT 18  AST 23  GLUCOSE 115*   No results for input(s): INR in the last 168 hours.  Recent Labs    03/20/21 2030  LIPASE 40        Imaging Studies: MR 3D Recon At Scanner  Result Date: 03/21/2021 CLINICAL DATA:  46 year old female with history of right upper quadrant abdominal pain for the past 2 days. EXAM: MRI ABDOMEN WITHOUT AND WITH CONTRAST (INCLUDING MRCP) TECHNIQUE: Multiplanar multisequence MR imaging of the abdomen was performed both before and after the administration of intravenous contrast. Heavily T2-weighted images of the biliary and pancreatic ducts were obtained, and three-dimensional MRCP images were rendered by post processing. CONTRAST:  90mL GADAVIST GADOBUTROL 1 MMOL/ML IV SOLN COMPARISON:  Abdominal MRI 05/26/2015. FINDINGS: Lower chest: Unremarkable. Hepatobiliary: Mild diffuse loss of signal intensity throughout the hepatic parenchyma on out of phase dual echo images, indicative of a background of mild hepatic steatosis (improved compared to the prior study). No suspicious cystic or solid hepatic lesions. Very mild intra and extrahepatic biliary ductal dilatation is noted on MRCP images. Common bile duct measures up to 7 mm in the porta hepatis. There is abrupt termination of the distal common bile duct immediately above the level of the ampulla, best appreciated on  coronal image 16 of series 3, where there is a  strong suggestion of a distal ductal stone, which is difficult to corroborate on additional pulse sequences, but suspected on several, including MRCP image 3 of series 16. There are multiple small filling defects lying dependently within the gallbladder, indicative of gallstones. Gallbladder is only moderately distended. Gallbladder wall does not appear thickened. There is a trace volume of pericholecystic fluid, however. Pancreas: No pancreatic mass. No pancreatic ductal dilatation. No pancreatic or peripancreatic fluid collections or inflammatory changes. Spleen:  Unremarkable. Adrenals/Urinary Tract: Subcentimeter T1 hypointense, T2 hyperintense, nonenhancing lesion in the medial aspect of the upper pole of the right kidney, compatible with a small simple cyst. Left kidney and bilateral adrenal glands are normal in appearance. No hydroureteronephrosis in the visualized portions of the abdomen. Stomach/Bowel: Visualized portions are unremarkable. Vascular/Lymphatic: No aneurysm identified in the visualized abdominal vasculature. No lymphadenopathy noted in the abdomen. Other: No significant volume of ascites noted in the visualized portions of the peritoneal cavity. Musculoskeletal: No aggressive appearing osseous lesions are noted in the visualized portions of the skeleton. IMPRESSION: 1. Study is positive for cholelithiasis. Gallbladder is only moderately distended, however, there is pericholecystic fluid and soft tissue stranding, suggesting acute inflammation. 2. Very mild intra and extrahepatic biliary ductal dilatation with strong suggestion of choledocholithiasis in the very distal aspect of the common bile duct immediately before the ampulla. If there is clinical evidence of biliary tract obstruction, correlation with ERCP should be considered. 3. Mild hepatic steatosis. Electronically Signed   By: Vinnie Langton M.D.   On: 03/21/2021 05:53   MR ABDOMEN  MRCP W WO CONTAST  Result Date: 03/21/2021 CLINICAL DATA:  46 year old female with history of right upper quadrant abdominal pain for the past 2 days. EXAM: MRI ABDOMEN WITHOUT AND WITH CONTRAST (INCLUDING MRCP) TECHNIQUE: Multiplanar multisequence MR imaging of the abdomen was performed both before and after the administration of intravenous contrast. Heavily T2-weighted images of the biliary and pancreatic ducts were obtained, and three-dimensional MRCP images were rendered by post processing. CONTRAST:  49mL GADAVIST GADOBUTROL 1 MMOL/ML IV SOLN COMPARISON:  Abdominal MRI 05/26/2015. FINDINGS: Lower chest: Unremarkable. Hepatobiliary: Mild diffuse loss of signal intensity throughout the hepatic parenchyma on out of phase dual echo images, indicative of a background of mild hepatic steatosis (improved compared to the prior study). No suspicious cystic or solid hepatic lesions. Very mild intra and extrahepatic biliary ductal dilatation is noted on MRCP images. Common bile duct measures up to 7 mm in the porta hepatis. There is abrupt termination of the distal common bile duct immediately above the level of the ampulla, best appreciated on coronal image 16 of series 3, where there is a strong suggestion of a distal ductal stone, which is difficult to corroborate on additional pulse sequences, but suspected on several, including MRCP image 3 of series 16. There are multiple small filling defects lying dependently within the gallbladder, indicative of gallstones. Gallbladder is only moderately distended. Gallbladder wall does not appear thickened. There is a trace volume of pericholecystic fluid, however. Pancreas: No pancreatic mass. No pancreatic ductal dilatation. No pancreatic or peripancreatic fluid collections or inflammatory changes. Spleen:  Unremarkable. Adrenals/Urinary Tract: Subcentimeter T1 hypointense, T2 hyperintense, nonenhancing lesion in the medial aspect of the upper pole of the right kidney,  compatible with a small simple cyst. Left kidney and bilateral adrenal glands are normal in appearance. No hydroureteronephrosis in the visualized portions of the abdomen. Stomach/Bowel: Visualized portions are unremarkable. Vascular/Lymphatic: No aneurysm identified in the visualized abdominal vasculature. No lymphadenopathy noted in the  abdomen. Other: No significant volume of ascites noted in the visualized portions of the peritoneal cavity. Musculoskeletal: No aggressive appearing osseous lesions are noted in the visualized portions of the skeleton. IMPRESSION: 1. Study is positive for cholelithiasis. Gallbladder is only moderately distended, however, there is pericholecystic fluid and soft tissue stranding, suggesting acute inflammation. 2. Very mild intra and extrahepatic biliary ductal dilatation with strong suggestion of choledocholithiasis in the very distal aspect of the common bile duct immediately before the ampulla. If there is clinical evidence of biliary tract obstruction, correlation with ERCP should be considered. 3. Mild hepatic steatosis. Electronically Signed   By: Vinnie Langton M.D.   On: 03/21/2021 05:53   US Abdomen Limited RUQ (LIVER/GB)  Result Date: 03/20/2021 CLINICAL DATA:  Right upper quadrant pain for 2 days. EXAM: ULTRASOUND ABDOMEN LIMITED RIGHT UPPER QUADRANT COMPARISON:  Abdominal MRI 05/26/2015 FINDINGS: Gallbladder: Physiologically distended. Multiple gallstones. No gallbladder wall thickening. There is no pericholecystic fluid. Positive sonographic Murphy sign noted by sonographer. Common bile duct: Diameter: 9 mm, dilated. The distal common bile duct is not well visualized. Liver: Heterogeneous increased parenchymal echogenicity. No focal hepatic lesion. No capsular nodularity. Portal vein is patent on color Doppler imaging with normal direction of blood flow towards the liver. Other: No right upper quadrant ascites. IMPRESSION: 1. Gallstones. No gallbladder wall  thickening or pericholecystic fluid, however technologist reports a positive sonographic Murphy sign. This raises concern for acute cholecystitis. 2. Dilated common bile duct at 9 mm. The distal common bile duct is not well seen on the current exam. 3. Hepatic steatosis. Electronically Signed   By: Keith Rake M.D.   On: 03/20/2021 22:01    Assessment:   # acute cholecystitis with choledocholithiasis - LFTs wnl - Imaging with high suspicion for choledocholithiasis in setting of cholecystitis as well - mild IH/EH dilation; CBD 71mm - :abrupt termination of the distal common bile duct immediately above the level of the ampulla where there is a strong suggestion of a distal ductal stone - no pancreatitis  # abdominal pain, n/v 2/2 above  # obesity  Plan:  ERCP planned for today. D/w ercp provider Npo MRI MRCP reviewed Labs reviewd Continue zosyn Hold dvt ppx in anticipation of procedure Avoid nsaids Surgery following for cholecystectomy  Endoscopic retrograde cholangiopancreatography with possible sphincterotomy, biopsy, brushings, stone/sludge removal, stent placement and interventions as necessary has been discussed with the patient/patient representative. Informed consent was obtained from the patient/patient representative after explaining the indication, nature, and risks of the procedure including but not limited to death, bleeding, perforation, infection, pancreatitis, missed neoplasm/lesions, cardiorespiratory compromise, and reaction to medications. Opportunity for questions was given and appropriate answers were provided. Patient/patient representativehas verbalized understanding is amenable to undergoing the procedure.   I personally performed the service.  Management of other medical comorbidities as per primary team  Thank you for allowing [place] to participate in this patient's care. Please don't hesitate to call if any questions or concerns arise.   Annamaria Helling, DO [facility]  Portions of the record may have been created with voice recognition software. Occasional wrong-word or 'sound-a-like' substitutions may have occurred due to the inherent limitations of voice recognition software.  Read the chart carefully and recognize, using context, where substitutions may have occurred.

## 2021-03-21 NOTE — Anesthesia Postprocedure Evaluation (Signed)
Anesthesia Post Note  Patient: Beth Savage  Procedure(s) Performed: XI ROBOTIC ASSISTED LAPAROSCOPIC CHOLECYSTECTOMY (Abdomen) INDOCYANINE GREEN FLUORESCENCE IMAGING (ICG) (Abdomen)  Patient location during evaluation: PACU Anesthesia Type: General Level of consciousness: awake and alert, oriented and patient cooperative Pain management: pain level controlled Vital Signs Assessment: post-procedure vital signs reviewed and stable Respiratory status: spontaneous breathing, nonlabored ventilation and respiratory function stable Cardiovascular status: blood pressure returned to baseline and stable Postop Assessment: adequate PO intake Anesthetic complications: no   No notable events documented.   Last Vitals:  Vitals:   03/21/21 2025 03/21/21 2028  BP:    Pulse:    Resp:    Temp:    SpO2: 90% 94%    Last Pain:  Vitals:   03/21/21 2025  TempSrc:   PainSc: [place]

## 2021-03-21 NOTE — ED Notes (Signed)
Sent msg to provider re: give AM synthroid or keep strictly NPO before scheduled ERCP.

## 2021-03-21 NOTE — H&P (Signed)
History and Physical    Patient: Beth Savage DOB: July 01, 1975 DOA: 03/21/2021 DOS: the patient was seen and examined on 03/21/2021 PCP: Simona Huh, NP  Patient coming from: Home  Chief Complaint:  Chief Complaint  Patient presents with   Abdominal Pain    HPI: Beth Savage is a 46 y.o. female with medical history significant of diabetes mellitus, hypothyroidism, PCOS, anxiety and depression presented to ED with abdominal pain, nausea and vomiting since Saturday.  Pain is progressively worsening, mostly in right upper quadrant, radiates to her back, increase by applying pressure and has not noticed any relieving factors. Pain was associated with nausea and vomiting, unable to keep anything down.  Denies any fever or chills.  Appetite decreased.  No diarrhea, no bowel movement for the past 2 days stating that there was nothing in my belly.  No urinary symptoms.  ED course.  Hemodynamically stable.  Found to have mild leukocytosis, CMP within normal limit.  Right upper quadrant ultrasound with gallbladder wall thickening and positive Murphy sign with gallstones.  Concern of cholecystitis.  Surgery and GI was consulted.  Surgery ordered MRCP which shows concern of choledocholithiasis. Surgery is recommending ERCP before proceeding to cholecystectomy. [facility] was consulted for admission for antibiotics and pain control. She was started on Zosyn and pain management  Review of Systems: As mentioned in the history of present illness. All other systems reviewed and are negative. Past Medical History:  Diagnosis Date   Anxiety and depression    Diabetes mellitus without complication ([place])    Gallstone    PCOS (polycystic ovarian syndrome)    Past Surgical History:  Procedure Laterality Date   no surgery history     Social History:  reports that she has been smoking cigarettes. She has been smoking an average of .3 packs per day. She has never used smokeless tobacco.  She reports current alcohol use. She reports that she does not currently use drugs.  No Known Allergies  Family History  Problem Relation Age of Onset   COPD Mother    Heart disease Mother    Rheum arthritis Maternal Grandmother    Asthma Daughter    Eczema Daughter     Prior to Admission medications   Medication Sig Start Date End Date Taking? Authorizing Provider  ALPRAZolam Duanne Moron) 0.5 MG tablet Take 0.5 mg by mouth 2 (two) times daily. 03/25/18   [provider]  Desogestrel-Ethinyl Estradiol (ISIBLOOM PO) Take by mouth.    [provider]  Ferrous Sulfate (IRON PO) Take by mouth once a week.    [provider]  levothyroxine (SYNTHROID) 175 MCG tablet Take 175 mcg by mouth daily. 06/05/18   [provider]  MAGNESIUM PO Take by mouth.    [provider]  metFORMIN (GLUCOPHAGE-XR) 750 MG 24 hr tablet Take 750 mg by mouth daily with breakfast. Patient not taking: Reported on 03/17/2020    [provider]  OZEMPIC, 1 MG/DOSE, 4 MG/3ML SOPN Inject 1 mg as directed once a week. 12/30/19   [provider]  phentermine (ADIPEX-P) 37.5 MG tablet Take 56.25 mg by mouth daily. 02/16/20   [provider]  TRINTELLIX 10 MG TABS tablet Take 10 mg by mouth daily. 02/16/20   [provider]  Vitamin D, Ergocalciferol, (DRISDOL) 1.25 MG (50000 UT) CAPS capsule Take 50,000 Units by mouth once a week. 04/24/18   [provider]    Physical Exam: Vitals:   03/21/21 0145 03/21/21 4315  03/21/21 0515 03/21/21 0730  BP:  (!) 177/111 (!) 157/94 (!) 166/94  Pulse:  93 87 89  Resp: 16 20 16 16   Temp:      TempSrc:      SpO2:  97% 95% 100%  Weight:      Height:       Vitals:   03/21/21 0145 03/21/21 0415 03/21/21 0515 03/21/21 0730  BP:  (!) 177/111 (!) 157/94 (!) 166/94  Pulse:  93 87 89  Resp: 16 20 16 16   Temp:      TempSrc:      SpO2:  97% 95% 100%  Weight:      Height:       General: Vital signs  reviewed.  Patient is well-developed and well-nourished, in no acute distress and cooperative with exam.  Head: Normocephalic and atraumatic. Eyes: EOMI, conjunctivae normal, no scleral icterus.  Neck: Supple, trachea midline, normal ROM,  Cardiovascular: RRR, S1 normal, S2 normal, no murmurs, gallops, or rubs. Pulmonary/Chest: Clear to auscultation bilaterally, no wheezes, rales, or rhonchi. Abdominal: Soft, RUQ tenderness with positive Murphy sign, non-distended, BS +,  Extremities: No lower extremity edema bilaterally,  pulses symmetric and intact bilaterally. No cyanosis or clubbing. Neurological: A&O x3, Strength is normal and symmetric bilaterally, cranial nerve II-XII are grossly intact, no focal motor deficit, sensory intact to light touch bilaterally.  Skin: Warm, dry and intact. No rashes or erythema. Psychiatric: Normal mood and affect. speech and behavior is normal. Cognition and memory are normal.   Data Reviewed: All labs and imaging were reviewed.  Summary as above in ED course.  Assessment and Plan: No notes have been filed under this hospital service. Service: Hospitalist  Cholecystitis with choledocholithiasis.  Imaging and presentation concerning for cholecystitis and choledocholithiasis. General surgery is recommending ERCP before proceeding for cholecystectomy with cholangiogram.  received a dose of Zosyn in ED. Going for ERCP with GI later today. Most likely cholecystectomy tomorrow -Keep her n.p.o. -Continue Zosyn -IV Dilaudid for pain management  Diabetes mellitus.  Patient was on metformin and Ozempic at home. -SSI  Hypothyroidism. -Check TSH-0.429 -Continue home dose of Synthroid  Anxiety and depression. -Restart home medications after the med rec  Advance Care Planning:   Code Status: Full Code   Consults: General surgery                   Gastroenterology  Family Communication:   Severity of Illness: The appropriate patient status for this  patient is INPATIENT. Inpatient status is judged to be reasonable and necessary in order to provide the required intensity of service to ensure the patient's safety. The patient's presenting symptoms, physical exam findings, and initial radiographic and laboratory data in the context of their chronic comorbidities is felt to place them at high risk for further clinical deterioration. Furthermore, it is not anticipated that the patient will be medically stable for discharge from the hospital within 2 midnights of admission.   * I certify that at the point of admission it is my clinical judgment that the patient will require inpatient hospital care spanning beyond 2 midnights from the point of admission due to high intensity of service, high risk for further deterioration and high frequency of surveillance required.*  Author: Lorella Nimrod, MD 03/21/2021 8:19 AM  For on call review [email or web address].

## 2021-03-22 ENCOUNTER — Encounter: Payer: Self-pay | Admitting: Gastroenterology

## 2021-03-22 DIAGNOSIS — K8000 Calculus of gallbladder with acute cholecystitis without obstruction: Secondary | ICD-10-CM | POA: Diagnosis not present

## 2021-03-22 LAB — CBC
HCT: 37.2 % (ref 36.0–46.0)
Hemoglobin: 11.9 g/dL — ABNORMAL LOW (ref 12.0–15.0)
MCH: 27 pg (ref 26.0–34.0)
MCHC: 32 g/dL (ref 30.0–36.0)
MCV: 84.4 fL (ref 80.0–100.0)
Platelets: 362 10*3/uL (ref 150–400)
RBC: 4.41 MIL/uL (ref 3.87–5.11)
RDW: 13.5 % (ref 11.5–15.5)
WBC: 12.8 10*3/uL — ABNORMAL HIGH (ref 4.0–10.5)
nRBC: 0 % (ref 0.0–0.2)

## 2021-03-22 LAB — COMPREHENSIVE METABOLIC PANEL
ALT: 96 U/L — ABNORMAL HIGH (ref 0–44)
AST: 66 U/L — ABNORMAL HIGH (ref 15–41)
Albumin: 3.3 g/dL — ABNORMAL LOW (ref 3.5–5.0)
Alkaline Phosphatase: 95 U/L (ref 38–126)
Anion gap: 10 (ref 5–15)
BUN: 7 mg/dL (ref 6–20)
CO2: 26 mmol/L (ref 22–32)
Calcium: 8.3 mg/dL — ABNORMAL LOW (ref 8.9–10.3)
Chloride: 99 mmol/L (ref 98–111)
Creatinine, Ser: 0.57 mg/dL (ref 0.44–1.00)
GFR, Estimated: >60 mL/min (ref >60)
Glucose, Bld: 119 mg/dL — ABNORMAL HIGH (ref 70–99)
Potassium: 4.3 mmol/L (ref 3.5–5.1)
Sodium: 135 mmol/L (ref 135–145)
Total Bilirubin: 0.6 mg/dL (ref 0.3–1.2)
Total Protein: 6.9 g/dL (ref 6.5–8.1)

## 2021-03-22 LAB — HEMOGLOBIN A1C
Hgb A1c MFr Bld: 6.5 % — ABNORMAL HIGH (ref 4.8–5.6)
Mean Plasma Glucose: 140 mg/dL

## 2021-03-22 LAB — LIPASE, BLOOD: Lipase: 25 U/L (ref 11–51)

## 2021-03-22 LAB — HIV ANTIBODY (ROUTINE TESTING W REFLEX): HIV Screen 4th Generation wRfx: NONREACTIVE

## 2021-03-22 LAB — GLUCOSE, CAPILLARY
Glucose-Capillary: 115 mg/dL — ABNORMAL HIGH (ref 70–99)
Glucose-Capillary: 113 mg/dL — ABNORMAL HIGH (ref 70–99)
Glucose-Capillary: 161 mg/dL — ABNORMAL HIGH (ref 70–99)
Glucose-Capillary: 157 mg/dL — ABNORMAL HIGH (ref 70–99)
Glucose-Capillary: 156 mg/dL — ABNORMAL HIGH (ref 70–99)

## 2021-03-22 LAB — PROTIME-INR
INR: 1.1 (ref 0.8–1.2)
Prothrombin Time: 13.7 seconds (ref 11.4–15.2)

## 2021-03-22 MED ORDER — PHENTERMINE HCL 37.5 MG PO TABS: 56.2500 mg | ORAL_TABLET | Freq: Every day | ORAL | Status: DC

## 2021-03-22 MED ORDER — HYDROMORPHONE HCL 1 MG/ML IJ SOLN
0.5000 mg | INTRAMUSCULAR | Status: DC | PRN
  Administered 2021-03-24: 0.5 mg via INTRAVENOUS
  Filled 2021-03-22: qty 0.5

## 2021-03-22 MED ORDER — ALPRAZOLAM 0.25 MG PO TABS
1.0000 mg | ORAL_TABLET | Freq: Three times a day (TID) | ORAL | Status: DC | PRN
  Administered 2021-03-23 – 2021-03-27 (×3): 1 mg via ORAL
  Filled 2021-03-22 (×4): qty 4

## 2021-03-22 MED ORDER — ENOXAPARIN SODIUM 60 MG/0.6ML IJ SOSY
0.5000 mg/kg | PREFILLED_SYRINGE | INTRAMUSCULAR | Status: DC
  Administered 2021-03-22 – 2021-03-26 (×5): 50 mg via SUBCUTANEOUS
  Filled 2021-03-22 (×5): qty 0.6

## 2021-03-22 MED ORDER — OXYCODONE HCL 5 MG PO TABS
5.0000 mg | ORAL_TABLET | ORAL | Status: DC | PRN
  Administered 2021-03-22 – 2021-03-27 (×10): 5 mg via ORAL
  Filled 2021-03-22 (×12): qty 1

## 2021-03-22 MED ORDER — LABETALOL HCL 5 MG/ML IV SOLN
5.0000 mg | Freq: Once | INTRAVENOUS | Status: AC
  Administered 2021-03-22: 5 mg via INTRAVENOUS
  Filled 2021-03-22: qty 4

## 2021-03-22 MED ORDER — LEVOTHYROXINE SODIUM 50 MCG PO TABS
250.0000 ug | ORAL_TABLET | Freq: Every day | ORAL | Status: DC
  Administered 2021-03-23 – 2021-03-27 (×4): 250 ug via ORAL
  Filled 2021-03-22 (×6): qty 1

## 2021-03-22 MED ORDER — LEVOTHYROXINE SODIUM 100 MCG PO TABS: 200.0000 ug | ORAL_TABLET | Freq: Every day | ORAL | Status: DC

## 2021-03-22 MED ORDER — KETOROLAC TROMETHAMINE 15 MG/ML IJ SOLN
15.0000 mg | Freq: Four times a day (QID) | INTRAMUSCULAR | Status: DC
  Administered 2021-03-22 – 2021-03-25 (×12): 15 mg via INTRAVENOUS
  Filled 2021-03-22 (×12): qty 1

## 2021-03-22 NOTE — Progress Notes (Signed)
Patient ID: Beth Savage, female   DOB: Feb 19, 1975, 46 y.o.   MRN: [id number]     [place] [facility] Day(s): 1.   Interval History: Patient seen and examined, no acute events or new complaints overnight. Patient reports still having significant pain.  Patient also having significant drain output.  Vital signs in last 24 hours: [min-max] current  Temp:  [97.2 F (36.2 C)-98.9 F (37.2 C)] 98.1 F (36.7 C) (02/22 0828) Pulse Rate:  [78-104] 83 (02/22 0828) Resp:  [15-21] 20 (02/22 0535) BP: (128-189)/(80-103) 153/90 (02/22 0828) SpO2:  [89 %-99 %] 99 % (02/22 0828) Weight:  [102.1 kg] 102.1 kg (02/21 1348)     Height: 5\' 2"  (157.5 cm) Weight: 102.1 kg BMI (Calculated): 41.14   Physical Exam:  Constitutional: alert, cooperative and no distress  Respiratory: breathing non-labored at rest  Cardiovascular: regular rate and sinus rhythm  Gastrointestinal: soft, tender, and non-distended  Labs:  CBC Latest Ref Rng & Units 03/22/2021 03/20/2021  WBC 4.0 - 10.5 K/uL 12.8(H) 11.9(H)  Hemoglobin 12.0 - 15.0 g/dL 11.9(L) 12.8  Hematocrit 36.0 - 46.0 % 37.2 40.2  Platelets 150 - 400 K/uL 362 411(H)   CMP Latest Ref Rng & Units 03/22/2021 03/20/2021  Glucose 70 - 99 mg/dL 119(H) 115(H)  BUN 6 - 20 mg/dL 7 8  Creatinine 0.44 - 1.00 mg/dL 0.57 0.57  Sodium 135 - 145 mmol/L 135 134(L)  Potassium 3.5 - 5.1 mmol/L 4.3 3.9  Chloride 98 - 111 mmol/L 99 99  CO2 22 - 32 mmol/L 26 26  Calcium 8.9 - 10.3 mg/dL 8.3(L) 8.8(L)  Total Protein 6.5 - 8.1 g/dL 6.9 7.5  Total Bilirubin 0.3 - 1.2 mg/dL 0.6 0.6  Alkaline Phos 38 - 126 U/L 95 54  AST 15 - 41 U/L 66(H) 23  ALT 0 - 44 U/L 96(H) 18    Imaging studies: No new pertinent imaging studies   Assessment/Plan:  46 y.o. female with choledocholithiasis and acute cholecystitis 1 Day Post-Op s/p ERCP and robotic assisted laparoscopic cholecystectomy.  Patient today recovering slowly.  She is still with significant pain.  Patient  still need to continue IV pain medications.  Continue IV antibiotic therapy.  Still with high output through the drain.  Needs to follow-up closely.  CBC shows expected leukocytosis.  Adequate hemoglobin.  CMP with adequate electrolytes.  Normal bilirubin and alkaline phosphatase.  Minimal increase in AST and ALT.  These are expected postop.  I agree with Toradol for pain management as well.  May increase to 30 mg dose if needed.  I encouraged the patient to ambulate.  I will follow closely.   Arnold Long, MD

## 2021-03-22 NOTE — Anesthesia Postprocedure Evaluation (Signed)
Anesthesia Post Note  Patient: Beth Savage  Procedure(s) Performed: ENDOSCOPIC RETROGRADE CHOLANGIOPANCREATOGRAPHY (ERCP)  Patient location during evaluation: PACU Anesthesia Type: General Level of consciousness: awake and alert, oriented and patient cooperative Pain management: pain level controlled Vital Signs Assessment: post-procedure vital signs reviewed and stable Respiratory status: spontaneous breathing, nonlabored ventilation and respiratory function stable Cardiovascular status: blood pressure returned to baseline and stable Postop Assessment: adequate PO intake Anesthetic complications: no   No notable events documented.   Last Vitals:  Vitals:   03/21/21 2320 03/22/21 0037  BP: (!) 178/94 (!) 186/99  Pulse: 79 78  Resp: 15   Temp: 36.9 C   SpO2: 98%     Last Pain:  Vitals:   03/21/21 2340  TempSrc:   PainSc: [place]

## 2021-03-22 NOTE — Progress Notes (Signed)
Patient crying and complaining of intense gas pain that "won't go out.Beth KitchenMarland KitchenI can feel the gas moving around and it won't come out..I feel like I need to pass gas and can't."  Encouraged her to keep doing the things that she's been doing: walking, repositioning, drinking water and taking the PRN Miralax; PRN pain meds given with minimal relief. JP drain with 60 ml output. Daughter stated that her Mom was very anxious; offered Xanax PRN, but refused at this time over concern of exacerbating the "constipation". Will continue to monitor. Barbaraann Faster, RN 11:36 PM 03/22/2021

## 2021-03-22 NOTE — Progress Notes (Signed)
GI Inpatient Follow-up Note   Patient Identification: MYRISSA CHIPLEY is a 46 y.o. female admitted on 03/20/21 for RUQ pain, vomiting, diarrhea. ERCP yesterday afternoon followed by lap CCY yesterday evening for impacted stone in cystic duct and cholecystitis.    Subjective: reports fairly significant pain post op, controlled initially by morphine overnight then reports pain returns prior to next dose. Denies nausea/vomiting. Tolerated sips of water and sips of sprite overnight. No BM overnight. Daughter at bedside.    Scheduled Inpatient Medications:   insulin aspart  0-15 Units Subcutaneous Q4H   levothyroxine  175 mcg Oral Daily      Continuous Inpatient Infusions:    lactated ringers 100 mL/hr at 03/21/21 0852   piperacillin-tazobactam (ZOSYN)  IV 3.375 g (03/22/21 0524)      PRN Inpatient Medications:  acetaminophen **OR** acetaminophen, LORazepam, morphine injection, ondansetron **OR** ondansetron (ZOFRAN) IV, oxyCODONE-acetaminophen, polyethylene glycol   Review of Systems: Constitutional: Weight is stable.  Eyes: No changes in vision. ENT: No oral lesions, sore throat.  GI: see HPI.  Heme/Lymph: No easy bruising.  CV: No chest pain.  GU: No hematuria.  Integumentary: No rashes.  Neuro: No headaches.  Psych: No depression/anxiety.  Endocrine: No heat/cold intolerance.  Allergic/Immunologic: No urticaria.  Resp: No cough, SOB.  Musculoskeletal: No joint swelling.    Physical Examination: BP 128/80 (BP Location: Left Arm)    Pulse 84    Temp 98.6 F (37 C) (Oral)    Resp 20    Ht 5\' 2"  (1.575 m)    Wt 102.1 kg    LMP 03/05/2021 Comment: Negative urine pregnancy 03-20-2021 ([facility])   SpO2 99%    BMI 41.15 kg/m  Gen: NAD, alert and oriented x 4, appears uncomfortable, just received dose of morphine for patient reports 10/10 pain.  HEENT: PEERLA, EOMI, Neck: supple, no JVD or thyromegaly Chest: CTA bilaterally, no wheezes, crackles, or other adventitious sounds CV:  RRR, no m/g/c/r Abd: soft, diffusely tender, JP drain in place w/ dry dressing, noted surgical sites, JP drain w/ red serosanguinous output.  Ext: no edema, well perfused with 2+ pulses, Skin: no rash or lesions noted Lymph: no LAD   Data: Recent Labs       Lab Results  Component Value Date    WBC 12.8 (H) 03/22/2021    HGB 11.9 (L) 03/22/2021    HCT 37.2 03/22/2021    MCV 84.4 03/22/2021    PLT 362 03/22/2021      Last Labs       Recent Labs  Lab 03/20/21 2030 03/22/21 0516  HGB 12.8 11.9*      Recent Labs       Lab Results  Component Value Date    NA 135 03/22/2021    K 4.3 03/22/2021    CL 99 03/22/2021    CO2 26 03/22/2021    BUN 7 03/22/2021    CREATININE 0.57 03/22/2021      Recent Labs       Lab Results  Component Value Date    ALT 96 (H) 03/22/2021    AST 66 (H) 03/22/2021    ALKPHOS 95 03/22/2021    BILITOT 0.6 03/22/2021      Last Labs      Recent Labs  Lab 03/22/21 0516  INR 1.1          Assessment/Plan: Ms. Swanton is a 46 y.o. female  admitted for RUQ pain, nausea, vomiting. S/p ERCP and lap CCY yesterday. LFTs very  minimally bumped overnight likely as expected since ERCP yesterday. Lipase normal this AM. Only complaint this AM is post op pain and will defer to surgery for pain control regimen. On Zosyn and WBC minimally elevated but stable. Continue sips of clears for now increasing as tolerated.    Dr. Virgina Jock to see patient and provide further recommendations.       Please call with questions or concerns.       Laurine Blazer, PA-C [place]

## 2021-03-22 NOTE — Progress Notes (Signed)
PROGRESS NOTE    Beth Savage  HKV:[id number] DOB: 1975-10-15 DOA: 03/21/2021 PCP: Simona Huh, NP    Brief Narrative:  46 y.o. female with medical history significant of diabetes mellitus, hypothyroidism, PCOS, anxiety and depression presented to ED with abdominal pain, nausea and vomiting since Saturday.  Pain is progressively worsening, mostly in right upper quadrant, radiates to her back, increase by applying pressure and has not noticed any relieving factors. Pain was associated with nausea and vomiting, unable to keep anything down.  Denies any fever or chills.  Appetite decreased.  No diarrhea, no bowel movement for the past 2 days stating that there was nothing in my belly.  No urinary symptoms.  Clinical concern for choledocholithiasis.  Completed ERCP.  Biliary sludge noted.  Cystic duct stone noted biliary sphincterotomy performed.  Laparoscopic cholecystectomy performed same day.  Patient tolerated procedure reasonably well.  Has some postoperative pain is to be expected on postoperative day #1.   Assessment & Plan:   Principal Problem:   Cholecystitis, acute with cholelithiasis Active Problems:   Abnormal MRI, liver  Right upper quadrant pain Intractable nausea and vomiting Cystic duct stone Cholecystitis Patient with some significant postoperative pain on POD #1 This to be expected Discussed with general surgery Plan: Multimodal pain control APAP 650 mg every 6 hours as needed Scheduled Toradol 15 mg every 6 hours As needed oxycodone As needed Dilaudid for breakthrough As needed antiemetics IV fluids Clear liquid diet for now Advance as tolerated Recheck CMP in a.m.  Type 2 diabetes mellitus Home regimen of metformin and Ozempic Currently on sliding scale  Hypothyroidism PTA Synthroid  Anxiety and depression PTA Xanax  Morbid obesity BMI 41.15 Counseled patient This complicates overall care and prognosis    DVT prophylaxis: SQ  Lovenox Code Status: Full Family Communication: Daughter at bedside Disposition Plan: Status is: Inpatient Remains inpatient appropriate because: Postoperative day 1 status post laparoscopic cholecystectomy.  Postoperative pain control.  Possible discharge in 24 hours if pain is controlled.           Level of care: Med-Surg  Consultants:  GI General surgery  Procedures:  ERCP 2/21 Laparoscopic cholecystectomy 2/21  Antimicrobials: Zosyn   Subjective: Seen and examined.  Significant abdominal pain endorsed  Objective: Vitals:   03/21/21 2320 03/22/21 0037 03/22/21 0535 03/22/21 0828  BP: (!) 178/94 (!) 186/99 128/80 (!) 153/90  Pulse: 79 78 84 83  Resp: 15  20   Temp: 98.5 F (36.9 C)  98.6 F (37 C) 98.1 F (36.7 C)  TempSrc:   Oral Oral  SpO2: 98%  99% 99%  Weight:      Height:        Intake/Output Summary (Last 24 hours) at 03/22/2021 1251 Last data filed at 03/22/2021 1053 Gross per 24 hour  Intake 1920 ml  Output 290 ml  Net 1630 ml   Filed Weights   03/20/21 2027 03/21/21 1348  Weight: 99.8 kg 102.1 kg    Examination:  General exam: Mild to moderate distress due to pain Respiratory system: Clear to auscultation. Respiratory effort normal. Cardiovascular system: S1-S2, RRR, no murmurs, no pedal edema Gastrointestinal system: Obese, nondistended, tender to palpation right upper quadrant Central nervous system: Alert and oriented. No focal neurological deficits. Extremities: Symmetric 5 x 5 power. Skin: No rashes, lesions or ulcers Psychiatry: Judgement and insight appear normal. Mood & affect appropriate.     Data Reviewed: I have personally reviewed following labs and imaging studies  CBC: Recent Labs  Lab 03/20/21 2030 03/22/21 0516  WBC 11.9* 12.8*  NEUTROABS 8.3*  --   HGB 12.8 11.9*  HCT 40.2 37.2  MCV 84.6 84.4  PLT 411* 269   Basic Metabolic Panel: Recent Labs  Lab 03/20/21 2030 03/22/21 0516  NA 134* 135  K 3.9 4.3   CL 99 99  CO2 26 26  GLUCOSE 115* 119*  BUN 8 7  CREATININE 0.57 0.57  CALCIUM 8.8* 8.3*   GFR: Estimated Creatinine Clearance: 100.4 mL/min (by C-G formula based on SCr of 0.57 mg/dL). Liver Function Tests: Recent Labs  Lab 03/20/21 2030 03/22/21 0516  AST 23 66*  ALT 18 96*  ALKPHOS 54 95  BILITOT 0.6 0.6  PROT 7.5 6.9  ALBUMIN 3.8 3.3*   Recent Labs  Lab 03/20/21 2030 03/22/21 0516  LIPASE 40 25   No results for input(s): AMMONIA in the last 168 hours. Coagulation Profile: Recent Labs  Lab 03/22/21 0516  INR 1.1   Cardiac Enzymes: No results for input(s): CKTOTAL, CKMB, CKMBINDEX, TROPONINI in the last 168 hours. BNP (last 3 results) No results for input(s): PROBNP in the last 8760 hours. HbA1C: No results for input(s): HGBA1C in the last 72 hours. CBG: Recent Labs  Lab 03/21/21 1936 03/21/21 2357 03/22/21 0519 03/22/21 0826 03/22/21 1122  GLUCAP 173* 168* 115* 113* 161*   Lipid Profile: No results for input(s): CHOL, HDL, LDLCALC, TRIG, CHOLHDL, LDLDIRECT in the last 72 hours. Thyroid Function Tests: Recent Labs    03/20/21 2030  TSH 0.429   Anemia Panel: No results for input(s): VITAMINB12, FOLATE, FERRITIN, TIBC, IRON, RETICCTPCT in the last 72 hours. Sepsis Labs: No results for input(s): PROCALCITON, LATICACIDVEN in the last 168 hours.  Recent Results (from the past 240 hour(s))  Resp Panel by RT-PCR (Flu A&B, Covid) Nasopharyngeal Swab     Status: None   Collection Time: 03/20/21  8:30 PM   Specimen: Nasopharyngeal Swab; Nasopharyngeal(NP) swabs in vial transport medium  Result Value Ref Range Status   SARS Coronavirus 2 by RT PCR NEGATIVE NEGATIVE Final    Comment: (NOTE) SARS-CoV-2 target nucleic acids are NOT DETECTED.  The SARS-CoV-2 RNA is generally detectable in upper respiratory specimens during the acute phase of infection. The lowest concentration of SARS-CoV-2 viral copies this assay can detect is 138 copies/mL. A negative  result does not preclude SARS-Cov-2 infection and should not be used as the sole basis for treatment or other patient management decisions. A negative result may occur with  improper specimen collection/handling, submission of specimen other than nasopharyngeal swab, presence of viral mutation(s) within the areas targeted by this assay, and inadequate number of viral copies(<138 copies/mL). A negative result must be combined with clinical observations, patient history, and epidemiological information. The expected result is Negative.  Fact Sheet for Patients:  EntrepreneurPulse.com.au  Fact Sheet for Healthcare Providers:  IncredibleEmployment.be  This test is no t yet approved or cleared by the [place] FDA and  has been authorized for detection and/or diagnosis of SARS-CoV-2 by FDA under an Emergency Use Authorization (EUA). This EUA will remain  in effect (meaning this test can be used) for the duration of the COVID-19 declaration under Section 564(b)(1) of the Act, 21 U.S.C.section 360bbb-3(b)(1), unless the authorization is terminated  or revoked sooner.       Influenza A by PCR NEGATIVE NEGATIVE Final   Influenza B by PCR NEGATIVE NEGATIVE Final    Comment: (NOTE) The Xpert Xpress SARS-CoV-2/FLU/RSV plus assay is intended as an  aid in the diagnosis of influenza from Nasopharyngeal swab specimens and should not be used as a sole basis for treatment. Nasal washings and aspirates are unacceptable for Xpert Xpress SARS-CoV-2/FLU/RSV testing.  Fact Sheet for Patients: EntrepreneurPulse.com.au  Fact Sheet for Healthcare Providers: IncredibleEmployment.be  This test is not yet approved or cleared by the [place] FDA and has been authorized for detection and/or diagnosis of SARS-CoV-2 by FDA under an Emergency Use Authorization (EUA). This EUA will remain in effect (meaning this test can be used)  for the duration of the COVID-19 declaration under Section 564(b)(1) of the Act, 21 U.S.C. section 360bbb-3(b)(1), unless the authorization is terminated or revoked.  Performed at [facility], [address], [place], [place] [address]          Radiology Studies: MR 3D Recon At Scanner  Result Date: 03/21/2021 CLINICAL DATA:  46 year old female with history of right upper quadrant abdominal pain for the past 2 days. EXAM: MRI ABDOMEN WITHOUT AND WITH CONTRAST (INCLUDING MRCP) TECHNIQUE: Multiplanar multisequence MR imaging of the abdomen was performed both before and after the administration of intravenous contrast. Heavily T2-weighted images of the biliary and pancreatic ducts were obtained, and three-dimensional MRCP images were rendered by post processing. CONTRAST:  36mL GADAVIST GADOBUTROL 1 MMOL/ML IV SOLN COMPARISON:  Abdominal MRI 05/26/2015. FINDINGS: Lower chest: Unremarkable. Hepatobiliary: Mild diffuse loss of signal intensity throughout the hepatic parenchyma on out of phase dual echo images, indicative of a background of mild hepatic steatosis (improved compared to the prior study). No suspicious cystic or solid hepatic lesions. Very mild intra and extrahepatic biliary ductal dilatation is noted on MRCP images. Common bile duct measures up to 7 mm in the porta hepatis. There is abrupt termination of the distal common bile duct immediately above the level of the ampulla, best appreciated on coronal image 16 of series 3, where there is a strong suggestion of a distal ductal stone, which is difficult to corroborate on additional pulse sequences, but suspected on several, including MRCP image 3 of series 16. There are multiple small filling defects lying dependently within the gallbladder, indicative of gallstones. Gallbladder is only moderately distended. Gallbladder wall does not appear thickened. There is a trace volume of pericholecystic fluid, however. Pancreas: No  pancreatic mass. No pancreatic ductal dilatation. No pancreatic or peripancreatic fluid collections or inflammatory changes. Spleen:  Unremarkable. Adrenals/Urinary Tract: Subcentimeter T1 hypointense, T2 hyperintense, nonenhancing lesion in the medial aspect of the upper pole of the right kidney, compatible with a small simple cyst. Left kidney and bilateral adrenal glands are normal in appearance. No hydroureteronephrosis in the visualized portions of the abdomen. Stomach/Bowel: Visualized portions are unremarkable. Vascular/Lymphatic: No aneurysm identified in the visualized abdominal vasculature. No lymphadenopathy noted in the abdomen. Other: No significant volume of ascites noted in the visualized portions of the peritoneal cavity. Musculoskeletal: No aggressive appearing osseous lesions are noted in the visualized portions of the skeleton. IMPRESSION: 1. Study is positive for cholelithiasis. Gallbladder is only moderately distended, however, there is pericholecystic fluid and soft tissue stranding, suggesting acute inflammation. 2. Very mild intra and extrahepatic biliary ductal dilatation with strong suggestion of choledocholithiasis in the very distal aspect of the common bile duct immediately before the ampulla. If there is clinical evidence of biliary tract obstruction, correlation with ERCP should be considered. 3. Mild hepatic steatosis. Electronically Signed   By: Vinnie Langton M.D.   On: 03/21/2021 05:53   DG C-Arm 1-60 Min-No Report  Result Date: 03/21/2021 Fluoroscopy  was utilized by the requesting physician.  No radiographic interpretation.   MR ABDOMEN MRCP W WO CONTAST  Result Date: 03/21/2021 CLINICAL DATA:  46 year old female with history of right upper quadrant abdominal pain for the past 2 days. EXAM: MRI ABDOMEN WITHOUT AND WITH CONTRAST (INCLUDING MRCP) TECHNIQUE: Multiplanar multisequence MR imaging of the abdomen was performed both before and after the administration of  intravenous contrast. Heavily T2-weighted images of the biliary and pancreatic ducts were obtained, and three-dimensional MRCP images were rendered by post processing. CONTRAST:  49mL GADAVIST GADOBUTROL 1 MMOL/ML IV SOLN COMPARISON:  Abdominal MRI 05/26/2015. FINDINGS: Lower chest: Unremarkable. Hepatobiliary: Mild diffuse loss of signal intensity throughout the hepatic parenchyma on out of phase dual echo images, indicative of a background of mild hepatic steatosis (improved compared to the prior study). No suspicious cystic or solid hepatic lesions. Very mild intra and extrahepatic biliary ductal dilatation is noted on MRCP images. Common bile duct measures up to 7 mm in the porta hepatis. There is abrupt termination of the distal common bile duct immediately above the level of the ampulla, best appreciated on coronal image 16 of series 3, where there is a strong suggestion of a distal ductal stone, which is difficult to corroborate on additional pulse sequences, but suspected on several, including MRCP image 3 of series 16. There are multiple small filling defects lying dependently within the gallbladder, indicative of gallstones. Gallbladder is only moderately distended. Gallbladder wall does not appear thickened. There is a trace volume of pericholecystic fluid, however. Pancreas: No pancreatic mass. No pancreatic ductal dilatation. No pancreatic or peripancreatic fluid collections or inflammatory changes. Spleen:  Unremarkable. Adrenals/Urinary Tract: Subcentimeter T1 hypointense, T2 hyperintense, nonenhancing lesion in the medial aspect of the upper pole of the right kidney, compatible with a small simple cyst. Left kidney and bilateral adrenal glands are normal in appearance. No hydroureteronephrosis in the visualized portions of the abdomen. Stomach/Bowel: Visualized portions are unremarkable. Vascular/Lymphatic: No aneurysm identified in the visualized abdominal vasculature. No lymphadenopathy noted in  the abdomen. Other: No significant volume of ascites noted in the visualized portions of the peritoneal cavity. Musculoskeletal: No aggressive appearing osseous lesions are noted in the visualized portions of the skeleton. IMPRESSION: 1. Study is positive for cholelithiasis. Gallbladder is only moderately distended, however, there is pericholecystic fluid and soft tissue stranding, suggesting acute inflammation. 2. Very mild intra and extrahepatic biliary ductal dilatation with strong suggestion of choledocholithiasis in the very distal aspect of the common bile duct immediately before the ampulla. If there is clinical evidence of biliary tract obstruction, correlation with ERCP should be considered. 3. Mild hepatic steatosis. Electronically Signed   By: Vinnie Langton M.D.   On: 03/21/2021 05:53   US Abdomen Limited RUQ (LIVER/GB)  Result Date: 03/20/2021 CLINICAL DATA:  Right upper quadrant pain for 2 days. EXAM: ULTRASOUND ABDOMEN LIMITED RIGHT UPPER QUADRANT COMPARISON:  Abdominal MRI 05/26/2015 FINDINGS: Gallbladder: Physiologically distended. Multiple gallstones. No gallbladder wall thickening. There is no pericholecystic fluid. Positive sonographic Murphy sign noted by sonographer. Common bile duct: Diameter: 9 mm, dilated. The distal common bile duct is not well visualized. Liver: Heterogeneous increased parenchymal echogenicity. No focal hepatic lesion. No capsular nodularity. Portal vein is patent on color Doppler imaging with normal direction of blood flow towards the liver. Other: No right upper quadrant ascites. IMPRESSION: 1. Gallstones. No gallbladder wall thickening or pericholecystic fluid, however technologist reports a positive sonographic Murphy sign. This raises concern for acute cholecystitis. 2. Dilated common bile duct at  9 mm. The distal common bile duct is not well seen on the current exam. 3. Hepatic steatosis. Electronically Signed   By: Keith Rake M.D.   On: 03/20/2021 22:01         Scheduled Meds:  insulin aspart  0-15 Units Subcutaneous Q4H   ketorolac  15 mg Intravenous Q6H   levothyroxine  175 mcg Oral Daily   Continuous Infusions:  lactated ringers 100 mL/hr at 03/21/21 0852   piperacillin-tazobactam (ZOSYN)  IV 3.375 g (03/22/21 0524)     LOS: 1 day        Sidney Ace, MD [facility]   If 7PM-7AM, please contact night-coverage  03/22/2021, 12:51 PM

## 2021-03-23 DIAGNOSIS — K8000 Calculus of gallbladder with acute cholecystitis without obstruction: Secondary | ICD-10-CM | POA: Diagnosis not present

## 2021-03-23 LAB — GLUCOSE, CAPILLARY
Glucose-Capillary: 105 mg/dL — ABNORMAL HIGH (ref 70–99)
Glucose-Capillary: 107 mg/dL — ABNORMAL HIGH (ref 70–99)
Glucose-Capillary: 129 mg/dL — ABNORMAL HIGH (ref 70–99)
Glucose-Capillary: 132 mg/dL — ABNORMAL HIGH (ref 70–99)
Glucose-Capillary: 150 mg/dL — ABNORMAL HIGH (ref 70–99)
Glucose-Capillary: 165 mg/dL — ABNORMAL HIGH (ref 70–99)
Glucose-Capillary: 96 mg/dL (ref 70–99)

## 2021-03-23 LAB — COMPREHENSIVE METABOLIC PANEL
ALT: 56 U/L — ABNORMAL HIGH (ref 0–44)
AST: 20 U/L (ref 15–41)
Albumin: 3.2 g/dL — ABNORMAL LOW (ref 3.5–5.0)
Alkaline Phosphatase: 84 U/L (ref 38–126)
Anion gap: 10 (ref 5–15)
BUN: 8 mg/dL (ref 6–20)
CO2: 27 mmol/L (ref 22–32)
Calcium: 8.1 mg/dL — ABNORMAL LOW (ref 8.9–10.3)
Chloride: 98 mmol/L (ref 98–111)
Creatinine, Ser: 0.52 mg/dL (ref 0.44–1.00)
GFR, Estimated: >60 mL/min (ref >60)
Glucose, Bld: 128 mg/dL — ABNORMAL HIGH (ref 70–99)
Potassium: 3.9 mmol/L (ref 3.5–5.1)
Sodium: 135 mmol/L (ref 135–145)
Total Bilirubin: 0.8 mg/dL (ref 0.3–1.2)
Total Protein: 6.7 g/dL (ref 6.5–8.1)

## 2021-03-23 LAB — CBC WITH DIFFERENTIAL/PLATELET
Abs Immature Granulocytes: 0.04 10*3/uL (ref 0.00–0.07)
Basophils Absolute: 0 10*3/uL (ref 0.0–0.1)
Basophils Relative: 0 %
Eosinophils Absolute: 0.2 10*3/uL (ref 0.0–0.5)
Eosinophils Relative: 2 %
HCT: 35.8 % — ABNORMAL LOW (ref 36.0–46.0)
Hemoglobin: 11.7 g/dL — ABNORMAL LOW (ref 12.0–15.0)
Immature Granulocytes: 0 %
Lymphocytes Relative: 17 %
Lymphs Abs: 2.2 10*3/uL (ref 0.7–4.0)
MCH: 27.6 pg (ref 26.0–34.0)
MCHC: 32.7 g/dL (ref 30.0–36.0)
MCV: 84.4 fL (ref 80.0–100.0)
Monocytes Absolute: 1 10*3/uL (ref 0.1–1.0)
Monocytes Relative: 7 %
Neutro Abs: 9.8 10*3/uL — ABNORMAL HIGH (ref 1.7–7.7)
Neutrophils Relative %: 74 %
Platelets: 366 10*3/uL (ref 150–400)
RBC: 4.24 MIL/uL (ref 3.87–5.11)
RDW: 14 % (ref 11.5–15.5)
WBC: 13.3 10*3/uL — ABNORMAL HIGH (ref 4.0–10.5)
nRBC: 0 % (ref 0.0–0.2)

## 2021-03-23 LAB — SURGICAL PATHOLOGY

## 2021-03-23 MED ORDER — ALUM & MAG HYDROXIDE-SIMETH 200-200-20 MG/5ML PO SUSP
30.0000 mL | ORAL | Status: DC | PRN
  Administered 2021-03-23 – 2021-03-25 (×3): 30 mL via ORAL
  Filled 2021-03-23 (×3): qty 30

## 2021-03-23 MED ORDER — SIMETHICONE 80 MG PO CHEW
80.0000 mg | CHEWABLE_TABLET | Freq: Four times a day (QID) | ORAL | Status: DC
  Administered 2021-03-23 – 2021-03-27 (×18): 80 mg via ORAL
  Filled 2021-03-23 (×18): qty 1

## 2021-03-23 MED ORDER — SENNOSIDES-DOCUSATE SODIUM 8.6-50 MG PO TABS
1.0000 | ORAL_TABLET | Freq: Two times a day (BID) | ORAL | Status: DC
  Administered 2021-03-23 – 2021-03-27 (×8): 1 via ORAL
  Filled 2021-03-23 (×8): qty 1

## 2021-03-23 NOTE — Progress Notes (Signed)
Patient ID: Beth CAMERER, female   DOB: 01-17-1976, 46 y.o.   MRN: [id number]     [facility] Day(s): 2.   Interval History: Patient seen and examined, no acute events or new complaints overnight. Patient reports still having significant pain.  Still needing IV pain medications.  Pain improved elevated bili after passing gas.  Still no bowel movement..  Vital signs in last 24 hours: [min-max] current  Temp:  [98.2 F (36.8 C)-99.1 F (37.3 C)] 98.3 F (36.8 C) (02/23 0759) Pulse Rate:  [88-96] 88 (02/23 0759) Resp:  [18-20] 18 (02/23 0759) BP: (134-174)/(68-99) 164/75 (02/23 0759) SpO2:  [99 %-100 %] 99 % (02/23 0759)     Height: 5\' 2"  (157.5 cm) Weight: 102.1 kg BMI (Calculated): 41.14   Physical Exam:  Constitutional: alert, cooperative and no distress  Respiratory: breathing non-labored at rest  Cardiovascular: regular rate and sinus rhythm  Gastrointestinal: soft, tender in the right upper quadrant, and non-distended.  Drain in place.  Labs:  CBC Latest Ref Rng & Units 03/23/2021 03/22/2021 03/20/2021  WBC 4.0 - 10.5 K/uL 13.3(H) 12.8(H) 11.9(H)  Hemoglobin 12.0 - 15.0 g/dL 11.7(L) 11.9(L) 12.8  Hematocrit 36.0 - 46.0 % 35.8(L) 37.2 40.2  Platelets 150 - 400 K/uL 366 362 411(H)   CMP Latest Ref Rng & Units 03/23/2021 03/22/2021 03/20/2021  Glucose 70 - 99 mg/dL 128(H) 119(H) 115(H)  BUN 6 - 20 mg/dL 8 7 8   Creatinine 0.44 - 1.00 mg/dL 0.52 0.57 0.57  Sodium 135 - 145 mmol/L 135 135 134(L)  Potassium 3.5 - 5.1 mmol/L 3.9 4.3 3.9  Chloride 98 - 111 mmol/L 98 99 99  CO2 22 - 32 mmol/L 27 26 26   Calcium 8.9 - 10.3 mg/dL 8.1(L) 8.3(L) 8.8(L)  Total Protein 6.5 - 8.1 g/dL 6.7 6.9 7.5  Total Bilirubin 0.3 - 1.2 mg/dL 0.8 0.6 0.6  Alkaline Phos 38 - 126 U/L 84 95 54  AST 15 - 41 U/L 20 66(H) 23  ALT 0 - 44 U/L 56(H) 96(H) 18    Imaging studies: No new pertinent imaging studies   Assessment/Plan:  46 y.o. female with choledocholithiasis and acute  cholecystitis 2 Day Post-Op s/p ERCP and robotic assisted laparoscopic cholecystectomy.  Patient with some improvement after passing gas but still with significant pain in need of IV pain medications.  Patient still with decreased appetite.  She is tolerating liquids.  Slowly trying to tolerate solid food.  I agree with keeping her to continue with pain management.  I will continue close follow-up of drain output.  I encouraged the patient to ambulate.  Liver enzymes and lipase are within normal limits.  I will follow-up closely.  Arnold Long, MD

## 2021-03-23 NOTE — Progress Notes (Signed)
PROGRESS NOTE    Beth Savage  XTG:[id number] DOB: Dec 23, 1975 DOA: 03/21/2021 PCP: Simona Huh, NP    Brief Narrative:  46 y.o. female with medical history significant of diabetes mellitus, hypothyroidism, PCOS, anxiety and depression presented to ED with abdominal pain, nausea and vomiting since Saturday.  Pain is progressively worsening, mostly in right upper quadrant, radiates to her back, increase by applying pressure and has not noticed any relieving factors. Pain was associated with nausea and vomiting, unable to keep anything down.  Denies any fever or chills.  Appetite decreased.  No diarrhea, no bowel movement for the past 2 days stating that there was nothing in my belly.  No urinary symptoms.  Clinical concern for choledocholithiasis.  Completed ERCP.  Biliary sludge noted.  Cystic duct stone noted biliary sphincterotomy performed.  Laparoscopic cholecystectomy performed same day.  Patient tolerated procedure reasonably well.  Has some postoperative pain is to be expected on postoperative day #1.  Patient having persistent pain and bloating she attributes to gas.  Has not had bowel movement since Saturday 2/18   Assessment & Plan:   Principal Problem:   Cholecystitis, acute with cholelithiasis Active Problems:   Abnormal MRI, liver  Right upper quadrant pain Intractable nausea and vomiting Cystic duct stone Cholecystitis Patient with some significant postoperative pain on POD #1 This to be expected Discussed with general surgery Plan: Multimodal pain control including as needed Tylenol, scheduled Toradol, as needed oxycodone Avoid Dilaudid if possible As needed antiemetics Soft diet Bowel regimen Ambulate patient, out of bed to chair Discharge once pain control improved and patient has BM  Type 2 diabetes mellitus Home regimen of metformin and Ozempic Currently on sliding scale  Hypothyroidism PTA Synthroid  Anxiety and depression PTA Xanax Patient  was reluctant to take Xanax for fear that it would worsen her constipation.  I encouraged low-dose use in setting of acute anxiety  Morbid obesity BMI 41.15 Counseled patient This complicates overall care and prognosis    DVT prophylaxis: SQ Lovenox Code Status: Full Family Communication: Daughter at bedside Disposition Plan: Status is: Inpatient  Remains inpatient appropriate because: Postoperative day 2 status post laparoscopic cholecystectomy.  Postoperative pain control.  Bowel regimen.  Possible discharge in 24 hours if pain is controlled.   Level of care: Med-Surg  Consultants:  GI General surgery  Procedures:  ERCP 2/21 Laparoscopic cholecystectomy 2/21  Antimicrobials: Zosyn   Subjective: Seen and examined.  Significant abdominal pain endorsed.  Pain attributed to gas.  No BM yet  Objective: Vitals:   03/22/21 1631 03/22/21 2003 03/23/21 0312 03/23/21 0759  BP: 134/68 (!) 147/88 (!) 174/99 (!) 164/75  Pulse: 91 96 88 88  Resp: 20 20 20 18   Temp: 98.4 F (36.9 C) 99.1 F (37.3 C) 98.2 F (36.8 C) 98.3 F (36.8 C)  TempSrc: Oral   Oral  SpO2: 100% 99% 99% 99%  Weight:      Height:        Intake/Output Summary (Last 24 hours) at 03/23/2021 1136 Last data filed at 03/23/2021 1036 Gross per 24 hour  Intake 920.67 ml  Output 342 ml  Net 578.67 ml   Filed Weights   03/20/21 2027 03/21/21 1348  Weight: 99.8 kg 102.1 kg    Examination:  General exam: Mild distress due to pain Respiratory system: Clear to auscultation. Respiratory effort normal. Cardiovascular system: S1-S2, RRR, no murmurs, no pedal edema Gastrointestinal system: Obese, nondistended, tender to palpation right upper quadrant.  JP drain with 60  mill output Central nervous system: Alert and oriented. No focal neurological deficits. Extremities: Symmetric 5 x 5 power. Skin: No rashes, lesions or ulcers Psychiatry: Judgement and insight appear normal. Mood & affect appropriate.      Data Reviewed: I have personally reviewed following labs and imaging studies  CBC: Recent Labs  Lab 03/20/21 2030 03/22/21 0516 03/23/21 0515  WBC 11.9* 12.8* 13.3*  NEUTROABS 8.3*  --  9.8*  HGB 12.8 11.9* 11.7*  HCT 40.2 37.2 35.8*  MCV 84.6 84.4 84.4  PLT 411* 362 841   Basic Metabolic Panel: Recent Labs  Lab 03/20/21 2030 03/22/21 0516 03/23/21 0515  NA 134* 135 135  K 3.9 4.3 3.9  CL 99 99 98  CO2 26 26 27   GLUCOSE 115* 119* 128*  BUN 8 7 8   CREATININE 0.57 0.57 0.52  CALCIUM 8.8* 8.3* 8.1*   GFR: Estimated Creatinine Clearance: 100.4 mL/min (by C-G formula based on SCr of 0.52 mg/dL). Liver Function Tests: Recent Labs  Lab 03/20/21 2030 03/22/21 0516 03/23/21 0515  AST 23 66* 20  ALT 18 96* 56*  ALKPHOS 54 95 84  BILITOT 0.6 0.6 0.8  PROT 7.5 6.9 6.7  ALBUMIN 3.8 3.3* 3.2*   Recent Labs  Lab 03/20/21 2030 03/22/21 0516  LIPASE 40 25   No results for input(s): AMMONIA in the last 168 hours. Coagulation Profile: Recent Labs  Lab 03/22/21 0516  INR 1.1   Cardiac Enzymes: No results for input(s): CKTOTAL, CKMB, CKMBINDEX, TROPONINI in the last 168 hours. BNP (last 3 results) No results for input(s): PROBNP in the last 8760 hours. HbA1C: Recent Labs    03/22/21 0516  HGBA1C 6.5*   CBG: Recent Labs  Lab 03/22/21 1629 03/22/21 1919 03/23/21 0017 03/23/21 0357 03/23/21 0755  GLUCAP 157* 156* 165* 96 129*   Lipid Profile: No results for input(s): CHOL, HDL, LDLCALC, TRIG, CHOLHDL, LDLDIRECT in the last 72 hours. Thyroid Function Tests: Recent Labs    03/20/21 2030  TSH 0.429   Anemia Panel: No results for input(s): VITAMINB12, FOLATE, FERRITIN, TIBC, IRON, RETICCTPCT in the last 72 hours. Sepsis Labs: No results for input(s): PROCALCITON, LATICACIDVEN in the last 168 hours.  Recent Results (from the past 240 hour(s))  Resp Panel by RT-PCR (Flu A&B, Covid) Nasopharyngeal Swab     Status: None   Collection Time: 03/20/21   8:30 PM   Specimen: Nasopharyngeal Swab; Nasopharyngeal(NP) swabs in vial transport medium  Result Value Ref Range Status   SARS Coronavirus 2 by RT PCR NEGATIVE NEGATIVE Final    Comment: (NOTE) SARS-CoV-2 target nucleic acids are NOT DETECTED.  The SARS-CoV-2 RNA is generally detectable in upper respiratory specimens during the acute phase of infection. The lowest concentration of SARS-CoV-2 viral copies this assay can detect is 138 copies/mL. A negative result does not preclude SARS-Cov-2 infection and should not be used as the sole basis for treatment or other patient management decisions. A negative result may occur with  improper specimen collection/handling, submission of specimen other than nasopharyngeal swab, presence of viral mutation(s) within the areas targeted by this assay, and inadequate number of viral copies(<138 copies/mL). A negative result must be combined with clinical observations, patient history, and epidemiological information. The expected result is Negative.  Fact Sheet for Patients:  EntrepreneurPulse.com.au  Fact Sheet for Healthcare Providers:  IncredibleEmployment.be  This test is no t yet approved or cleared by the [place] FDA and  has been authorized for detection and/or diagnosis of SARS-CoV-2 by  FDA under an Emergency Use Authorization (EUA). This EUA will remain  in effect (meaning this test can be used) for the duration of the COVID-19 declaration under Section 564(b)(1) of the Act, 21 U.S.C.section 360bbb-3(b)(1), unless the authorization is terminated  or revoked sooner.       Influenza A by PCR NEGATIVE NEGATIVE Final   Influenza B by PCR NEGATIVE NEGATIVE Final    Comment: (NOTE) The Xpert Xpress SARS-CoV-2/FLU/RSV plus assay is intended as an aid in the diagnosis of influenza from Nasopharyngeal swab specimens and should not be used as a sole basis for treatment. Nasal washings and aspirates  are unacceptable for Xpert Xpress SARS-CoV-2/FLU/RSV testing.  Fact Sheet for Patients: EntrepreneurPulse.com.au  Fact Sheet for Healthcare Providers: IncredibleEmployment.be  This test is not yet approved or cleared by the [place] FDA and has been authorized for detection and/or diagnosis of SARS-CoV-2 by FDA under an Emergency Use Authorization (EUA). This EUA will remain in effect (meaning this test can be used) for the duration of the COVID-19 declaration under Section 564(b)(1) of the Act, 21 U.S.C. section 360bbb-3(b)(1), unless the authorization is terminated or revoked.  Performed at [facility], [address], [place], [place] [address]          Radiology Studies: DG C-Arm 1-60 Min-No Report  Result Date: 03/21/2021 Fluoroscopy was utilized by the requesting physician.  No radiographic interpretation.        Scheduled Meds:  enoxaparin (LOVENOX) injection  0.5 mg/kg Subcutaneous Q24H   insulin aspart  0-15 Units Subcutaneous Q4H   ketorolac  15 mg Intravenous Q6H   levothyroxine  250 mcg Oral Q0600   senna-docusate  1 tablet Oral BID   simethicone  80 mg Oral QID   Continuous Infusions:  lactated ringers 100 mL/hr at 03/21/21 0852   piperacillin-tazobactam (ZOSYN)  IV 12.5 mL/hr at 03/23/21 6144     LOS: 2 days        Sidney Ace, MD [facility]   If 7PM-7AM, please contact night-coverage  03/23/2021, 11:36 AM

## 2021-03-24 DIAGNOSIS — K8000 Calculus of gallbladder with acute cholecystitis without obstruction: Secondary | ICD-10-CM | POA: Diagnosis not present

## 2021-03-24 LAB — COMPREHENSIVE METABOLIC PANEL
ALT: 36 U/L (ref 0–44)
AST: 14 U/L — ABNORMAL LOW (ref 15–41)
Albumin: 2.9 g/dL — ABNORMAL LOW (ref 3.5–5.0)
Alkaline Phosphatase: 89 U/L (ref 38–126)
Anion gap: 8 (ref 5–15)
BUN: 7 mg/dL (ref 6–20)
CO2: 30 mmol/L (ref 22–32)
Calcium: 8.1 mg/dL — ABNORMAL LOW (ref 8.9–10.3)
Chloride: 96 mmol/L — ABNORMAL LOW (ref 98–111)
Creatinine, Ser: 0.41 mg/dL — ABNORMAL LOW (ref 0.44–1.00)
GFR, Estimated: >60 mL/min (ref >60)
Glucose, Bld: 159 mg/dL — ABNORMAL HIGH (ref 70–99)
Potassium: 3.8 mmol/L (ref 3.5–5.1)
Sodium: 134 mmol/L — ABNORMAL LOW (ref 135–145)
Total Bilirubin: 0.7 mg/dL (ref 0.3–1.2)
Total Protein: 6.7 g/dL (ref 6.5–8.1)

## 2021-03-24 LAB — GLUCOSE, CAPILLARY
Glucose-Capillary: 118 mg/dL — ABNORMAL HIGH (ref 70–99)
Glucose-Capillary: 128 mg/dL — ABNORMAL HIGH (ref 70–99)
Glucose-Capillary: 137 mg/dL — ABNORMAL HIGH (ref 70–99)
Glucose-Capillary: 146 mg/dL — ABNORMAL HIGH (ref 70–99)
Glucose-Capillary: 185 mg/dL — ABNORMAL HIGH (ref 70–99)

## 2021-03-24 LAB — CBC WITH DIFFERENTIAL/PLATELET
Abs Immature Granulocytes: 0.02 10*3/uL (ref 0.00–0.07)
Basophils Absolute: 0 10*3/uL (ref 0.0–0.1)
Basophils Relative: 0 %
Eosinophils Absolute: 0.2 10*3/uL (ref 0.0–0.5)
Eosinophils Relative: 2 %
HCT: 34.1 % — ABNORMAL LOW (ref 36.0–46.0)
Hemoglobin: 10.9 g/dL — ABNORMAL LOW (ref 12.0–15.0)
Immature Granulocytes: 0 %
Lymphocytes Relative: 17 %
Lymphs Abs: 1.7 10*3/uL (ref 0.7–4.0)
MCH: 27.1 pg (ref 26.0–34.0)
MCHC: 32 g/dL (ref 30.0–36.0)
MCV: 84.8 fL (ref 80.0–100.0)
Monocytes Absolute: 0.8 10*3/uL (ref 0.1–1.0)
Monocytes Relative: 8 %
Neutro Abs: 7.4 10*3/uL (ref 1.7–7.7)
Neutrophils Relative %: 73 %
Platelets: 307 10*3/uL (ref 150–400)
RBC: 4.02 MIL/uL (ref 3.87–5.11)
RDW: 13.8 % (ref 11.5–15.5)
WBC: 10.1 10*3/uL (ref 4.0–10.5)
nRBC: 0 % (ref 0.0–0.2)

## 2021-03-24 MED ORDER — INSULIN ASPART 100 UNIT/ML IJ SOLN
0.0000 [IU] | Freq: Three times a day (TID) | INTRAMUSCULAR | Status: DC
  Administered 2021-03-24 – 2021-03-25 (×3): 2 [IU] via SUBCUTANEOUS
  Administered 2021-03-26: 8 [IU] via SUBCUTANEOUS
  Filled 2021-03-24 (×4): qty 1

## 2021-03-24 MED ORDER — BISACODYL 10 MG RE SUPP
10.0000 mg | Freq: Every day | RECTAL | Status: DC | PRN
  Administered 2021-03-24: 10 mg via RECTAL
  Filled 2021-03-24: qty 1

## 2021-03-24 MED ORDER — INSULIN ASPART 100 UNIT/ML IJ SOLN
0.0000 [IU] | Freq: Every day | INTRAMUSCULAR | Status: DC
  Administered 2021-03-25: 22:00:00 3 [IU] via SUBCUTANEOUS
  Filled 2021-03-24 (×2): qty 1

## 2021-03-24 NOTE — Progress Notes (Signed)
PROGRESS NOTE    Beth Savage  EGB:[id number] DOB: 1975/06/20 DOA: 03/21/2021 PCP: Simona Huh, NP    Brief Narrative:  46 y.o. female with medical history significant of diabetes mellitus, hypothyroidism, PCOS, anxiety and depression presented to ED with abdominal pain, nausea and vomiting since Saturday.  Pain is progressively worsening, mostly in right upper quadrant, radiates to her back, increase by applying pressure and has not noticed any relieving factors. Pain was associated with nausea and vomiting, unable to keep anything down.  Denies any fever or chills.  Appetite decreased.  No diarrhea, no bowel movement for the past 2 days stating that there was nothing in my belly.  No urinary symptoms.  Clinical concern for choledocholithiasis.  Completed ERCP.  Biliary sludge noted.  Cystic duct stone noted biliary sphincterotomy performed.  Laparoscopic cholecystectomy performed same day.  Patient tolerated procedure reasonably well.  Has some postoperative pain is to be expected on postoperative day #1.  Patient having persistent pain and bloating she attributes to gas.  Has not had bowel movement since Saturday 2/18   Assessment & Plan:   Principal Problem:   Cholecystitis, acute with cholelithiasis Active Problems:   Abnormal MRI, liver  Right upper quadrant pain Intractable nausea and vomiting Cystic duct stone Cholecystitis Patient with some significant postoperative pain This to be expected Discussed with general surgery Pain control improving over interval Plan: Multimodal pain control including as needed Tylenol, scheduled Toradol, as needed oxycodone Avoid Dilaudid  As needed antiemetics Soft diet Bowel regimen, added suppository today Ambulate patient, out of bed to chair Discharge once pain control improved and patient has BM, hopefully in 24 hours  Type 2 diabetes mellitus Home regimen of metformin and Ozempic Currently on sliding  scale  Hypothyroidism PTA Synthroid  Anxiety and depression PTA Xanax Patient was reluctant to take Xanax for fear that it would worsen her constipation.  I encouraged low-dose use in setting of acute anxiety  Morbid obesity BMI 41.15 Counseled patient This complicates overall care and prognosis    DVT prophylaxis: SQ Lovenox Code Status: Full Family Communication: Daughter at bedside Disposition Plan: Status is: Inpatient  Remains inpatient appropriate because:  Postop day #3 status post laparoscopic cholecystectomy.  Still with some postoperative pain.  Needs bowel movement.  Hopeful discharge in 24 hours.   Level of care: Med-Surg  Consultants:  GI General surgery  Procedures:  ERCP 2/21 Laparoscopic cholecystectomy 2/21  Antimicrobials: Zosyn   Subjective: Seen and examined.  More sleepy this morning.  Received pain medication.  Still has not had BM.  Pain control improving.  Objective: Vitals:   03/23/21 1611 03/23/21 2029 03/24/21 0529 03/24/21 0759  BP: (!) 163/88 (!) 142/86 (!) 176/92 (!) 171/97  Pulse: 96 95 82 88  Resp: 16 16 16 19   Temp: 98.2 F (36.8 C) 98.4 F (36.9 C) 98.4 F (36.9 C) 97.8 F (36.6 C)  TempSrc: Oral Oral Oral Oral  SpO2: 98% 98% 100% 98%  Weight:      Height:        Intake/Output Summary (Last 24 hours) at 03/24/2021 1151 Last data filed at 03/23/2021 2335 Gross per 24 hour  Intake 164.99 ml  Output 80 ml  Net 84.99 ml   Filed Weights   03/20/21 2027 03/21/21 1348  Weight: 99.8 kg 102.1 kg    Examination:  General exam: No acute distress Respiratory system: Clear to auscultation. Respiratory effort normal. Cardiovascular system: S1-S2, RRR, no murmurs, no pedal edema Gastrointestinal system: Obese,  mild TTP, nondistended, JP drain in place Central nervous system: Alert and oriented. No focal neurological deficits. Extremities: Symmetric 5 x 5 power. Skin: No rashes, lesions or ulcers Psychiatry: Judgement and  insight appear normal. Mood & affect appropriate.     Data Reviewed: I have personally reviewed following labs and imaging studies  CBC: Recent Labs  Lab 03/20/21 2030 03/22/21 0516 03/23/21 0515 03/24/21 0514  WBC 11.9* 12.8* 13.3* 10.1  NEUTROABS 8.3*  --  9.8* 7.4  HGB 12.8 11.9* 11.7* 10.9*  HCT 40.2 37.2 35.8* 34.1*  MCV 84.6 84.4 84.4 84.8  PLT 411* 362 366 500   Basic Metabolic Panel: Recent Labs  Lab 03/20/21 2030 03/22/21 0516 03/23/21 0515 03/24/21 0514  NA 134* 135 135 134*  K 3.9 4.3 3.9 3.8  CL 99 99 98 96*  CO2 26 26 27 30   GLUCOSE 115* 119* 128* 159*  BUN 8 7 8 7   CREATININE 0.57 0.57 0.52 0.41*  CALCIUM 8.8* 8.3* 8.1* 8.1*   GFR: Estimated Creatinine Clearance: 100.4 mL/min (A) (by C-G formula based on SCr of 0.41 mg/dL (L)). Liver Function Tests: Recent Labs  Lab 03/20/21 2030 03/22/21 0516 03/23/21 0515 03/24/21 0514  AST 23 66* 20 14*  ALT 18 96* 56* 36  ALKPHOS 54 95 84 89  BILITOT 0.6 0.6 0.8 0.7  PROT 7.5 6.9 6.7 6.7  ALBUMIN 3.8 3.3* 3.2* 2.9*   Recent Labs  Lab 03/20/21 2030 03/22/21 0516  LIPASE 40 25   No results for input(s): AMMONIA in the last 168 hours. Coagulation Profile: Recent Labs  Lab 03/22/21 0516  INR 1.1   Cardiac Enzymes: No results for input(s): CKTOTAL, CKMB, CKMBINDEX, TROPONINI in the last 168 hours. BNP (last 3 results) No results for input(s): PROBNP in the last 8760 hours. HbA1C: Recent Labs    03/22/21 0516  HGBA1C 6.5*   CBG: Recent Labs  Lab 03/23/21 2026 03/23/21 2334 03/24/21 0329 03/24/21 0800 03/24/21 1147  GLUCAP 107* 105* 128* 118* 185*   Lipid Profile: No results for input(s): CHOL, HDL, LDLCALC, TRIG, CHOLHDL, LDLDIRECT in the last 72 hours. Thyroid Function Tests: No results for input(s): TSH, T4TOTAL, FREET4, T3FREE, THYROIDAB in the last 72 hours.  Anemia Panel: No results for input(s): VITAMINB12, FOLATE, FERRITIN, TIBC, IRON, RETICCTPCT in the last 72  hours. Sepsis Labs: No results for input(s): PROCALCITON, LATICACIDVEN in the last 168 hours.  Recent Results (from the past 240 hour(s))  Resp Panel by RT-PCR (Flu A&B, Covid) Nasopharyngeal Swab     Status: None   Collection Time: 03/20/21  8:30 PM   Specimen: Nasopharyngeal Swab; Nasopharyngeal(NP) swabs in vial transport medium  Result Value Ref Range Status   SARS Coronavirus 2 by RT PCR NEGATIVE NEGATIVE Final    Comment: (NOTE) SARS-CoV-2 target nucleic acids are NOT DETECTED.  The SARS-CoV-2 RNA is generally detectable in upper respiratory specimens during the acute phase of infection. The lowest concentration of SARS-CoV-2 viral copies this assay can detect is 138 copies/mL. A negative result does not preclude SARS-Cov-2 infection and should not be used as the sole basis for treatment or other patient management decisions. A negative result may occur with  improper specimen collection/handling, submission of specimen other than nasopharyngeal swab, presence of viral mutation(s) within the areas targeted by this assay, and inadequate number of viral copies(<138 copies/mL). A negative result must be combined with clinical observations, patient history, and epidemiological information. The expected result is Negative.  Fact Sheet for Patients:  EntrepreneurPulse.com.au  Fact Sheet for Healthcare Providers:  IncredibleEmployment.be  This test is no t yet approved or cleared by the [place] FDA and  has been authorized for detection and/or diagnosis of SARS-CoV-2 by FDA under an Emergency Use Authorization (EUA). This EUA will remain  in effect (meaning this test can be used) for the duration of the COVID-19 declaration under Section 564(b)(1) of the Act, 21 U.S.C.section 360bbb-3(b)(1), unless the authorization is terminated  or revoked sooner.       Influenza A by PCR NEGATIVE NEGATIVE Final   Influenza B by PCR NEGATIVE  NEGATIVE Final    Comment: (NOTE) The Xpert Xpress SARS-CoV-2/FLU/RSV plus assay is intended as an aid in the diagnosis of influenza from Nasopharyngeal swab specimens and should not be used as a sole basis for treatment. Nasal washings and aspirates are unacceptable for Xpert Xpress SARS-CoV-2/FLU/RSV testing.  Fact Sheet for Patients: EntrepreneurPulse.com.au  Fact Sheet for Healthcare Providers: IncredibleEmployment.be  This test is not yet approved or cleared by the [place] FDA and has been authorized for detection and/or diagnosis of SARS-CoV-2 by FDA under an Emergency Use Authorization (EUA). This EUA will remain in effect (meaning this test can be used) for the duration of the COVID-19 declaration under Section 564(b)(1) of the Act, 21 U.S.C. section 360bbb-3(b)(1), unless the authorization is terminated or revoked.  Performed at [facility], [address] [place], [place] [address]          Radiology Studies: No results found.      Scheduled Meds:  enoxaparin (LOVENOX) injection  0.5 mg/kg Subcutaneous Q24H   insulin aspart  0-15 Units Subcutaneous Q4H   ketorolac  15 mg Intravenous Q6H   levothyroxine  250 mcg Oral Q0600   senna-docusate  1 tablet Oral BID   simethicone  80 mg Oral QID   Continuous Infusions:  lactated ringers 100 mL/hr at 03/24/21 0246   piperacillin-tazobactam (ZOSYN)  IV 3.375 g (03/24/21 0538)     LOS: 3 days        Sidney Ace, MD [facility]   If 7PM-7AM, please contact night-coverage  03/24/2021, 11:51 AM

## 2021-03-24 NOTE — Progress Notes (Signed)
Pharmacy Antibiotic Note  Beth Savage is a 46 y.o. female admitted on 03/21/2021. Pharmacy has been consulted for Zosyn dosing for intra-abdominal infection.  Plan: Zosyn 3.375g IV q8h (4 hour infusion).  Height: 5\' 2"  (157.5 cm) Weight: 102.1 kg (225 lb) IBW/kg (Calculated) : 50.1  Temp (24hrs), Avg:98.2 F (36.8 C), Min:97.8 F (36.6 C), Max:98.4 F (36.9 C)  Recent Labs  Lab 03/20/21 2030 03/22/21 0516 03/23/21 0515 03/24/21 0514  WBC 11.9* 12.8* 13.3* 10.1  CREATININE 0.57 0.57 0.52 0.41*    Estimated Creatinine Clearance: 100.4 mL/min (A) (by C-G formula based on SCr of 0.41 mg/dL (L)).    No Known Allergies  Antimicrobials this admission: 2/21 Zosyn >>    Thank you for allowing pharmacy to be a part of this patients care.  Field Staniszewski O Erynne Kealey 03/24/2021 1:01 PM

## 2021-03-24 NOTE — TOC CM/SW Note (Signed)
°  Transition of Care [facility]) Screening Note   Patient Details  Name: Beth Savage Date of Birth: 04-08-1975   Transition of Care [facility]) CM/SW Contact:    Candie Chroman, LCSW Phone Number: 03/24/2021, 3:49 PM    Transition of Care Department [facility]) has reviewed patient and no TOC needs have been identified at this time. We will continue to monitor patient advancement through interdisciplinary progression rounds. If new patient transition needs arise, please place a TOC consult.

## 2021-03-24 NOTE — Progress Notes (Signed)
Patient ID: Beth Savage, female   DOB: 11-19-1975, 46 y.o.   MRN: [id number]     [place] [facility] Day(s): 3.   Interval History: Patient seen and examined, no acute events or new complaints overnight. Patient reports continue with right upper quadrant pain with persistent need of IV pain medication.  Also still having bowel movement.  She is passing gas and gas is making her feel better but still not moving her bowels.  Vital signs in last 24 hours: [min-max] current  Temp:  [97.8 F (36.6 C)-98.4 F (36.9 C)] 98.3 F (36.8 C) (02/24 1546) Pulse Rate:  [82-98] 98 (02/24 1546) Resp:  [16-19] 16 (02/24 1546) BP: (142-184)/(86-97) 184/90 (02/24 1546) SpO2:  [97 %-100 %] 97 % (02/24 1546)     Height: 5\' 2"  (157.5 cm) Weight: 102.1 kg BMI (Calculated): 41.14   Physical Exam:  Constitutional: alert, cooperative and no distress  Respiratory: breathing non-labored at rest  Cardiovascular: regular rate and sinus rhythm  Gastrointestinal: soft, moderate-tender, and non-distended.  Labs:  CBC Latest Ref Rng & Units 03/24/2021 03/23/2021 03/22/2021  WBC 4.0 - 10.5 K/uL 10.1 13.3(H) 12.8(H)  Hemoglobin 12.0 - 15.0 g/dL 10.9(L) 11.7(L) 11.9(L)  Hematocrit 36.0 - 46.0 % 34.1(L) 35.8(L) 37.2  Platelets 150 - 400 K/uL 307 366 362   CMP Latest Ref Rng & Units 03/24/2021 03/23/2021 03/22/2021  Glucose 70 - 99 mg/dL 159(H) 128(H) 119(H)  BUN 6 - 20 mg/dL 7 8 7   Creatinine 0.44 - 1.00 mg/dL 0.41(L) 0.52 0.57  Sodium 135 - 145 mmol/L 134(L) 135 135  Potassium 3.5 - 5.1 mmol/L 3.8 3.9 4.3  Chloride 98 - 111 mmol/L 96(L) 98 99  CO2 22 - 32 mmol/L 30 27 26   Calcium 8.9 - 10.3 mg/dL 8.1(L) 8.1(L) 8.3(L)  Total Protein 6.5 - 8.1 g/dL 6.7 6.7 6.9  Total Bilirubin 0.3 - 1.2 mg/dL 0.7 0.8 0.6  Alkaline Phos 38 - 126 U/L 89 84 95  AST 15 - 41 U/L 14(L) 20 66(H)  ALT 0 - 44 U/L 36 56(H) 96(H)    Imaging studies: No new pertinent imaging studies   Assessment/Plan:  46 y.o. female  with choledocholithiasis and acute cholecystitis 3 Day Post-Op s/p ERCP and robotic assisted laparoscopic cholecystectomy.  Patient recovering very slowly.  Still with significant pain in need of IV pain medications.  This expected after complicated cholecystectomy.  Patient also with decreasing drain output.  Drain consistency still concerning for mild bile leak, but decreasing in amount.  Most likely will heal spontaneously.  We will continue with drain in place.  We will continue with IV antibiotic therapy.  I agree with hospitalist to be aggressive on bowel regimen.  I encouraged patient to ambulate.  I will follow-up closely.  Arnold Long, MD

## 2021-03-25 ENCOUNTER — Inpatient Hospital Stay: Payer: BC Managed Care – PPO

## 2021-03-25 ENCOUNTER — Encounter: Payer: Self-pay | Admitting: Internal Medicine

## 2021-03-25 DIAGNOSIS — R1030 Lower abdominal pain, unspecified: Secondary | ICD-10-CM | POA: Diagnosis not present

## 2021-03-25 DIAGNOSIS — E282 Polycystic ovarian syndrome: Secondary | ICD-10-CM

## 2021-03-25 DIAGNOSIS — N83209 Unspecified ovarian cyst, unspecified side: Secondary | ICD-10-CM

## 2021-03-25 DIAGNOSIS — Z9049 Acquired absence of other specified parts of digestive tract: Secondary | ICD-10-CM

## 2021-03-25 DIAGNOSIS — K8000 Calculus of gallbladder with acute cholecystitis without obstruction: Secondary | ICD-10-CM | POA: Diagnosis not present

## 2021-03-25 DIAGNOSIS — N838 Other noninflammatory disorders of ovary, fallopian tube and broad ligament: Secondary | ICD-10-CM

## 2021-03-25 DIAGNOSIS — R109 Unspecified abdominal pain: Secondary | ICD-10-CM

## 2021-03-25 LAB — COMPREHENSIVE METABOLIC PANEL
ALT: 28 U/L (ref 0–44)
AST: 15 U/L (ref 15–41)
Albumin: 3.1 g/dL — ABNORMAL LOW (ref 3.5–5.0)
Alkaline Phosphatase: 107 U/L (ref 38–126)
Anion gap: 11 (ref 5–15)
BUN: 9 mg/dL (ref 6–20)
CO2: 26 mmol/L (ref 22–32)
Calcium: 8.5 mg/dL — ABNORMAL LOW (ref 8.9–10.3)
Chloride: 94 mmol/L — ABNORMAL LOW (ref 98–111)
Creatinine, Ser: 0.6 mg/dL (ref 0.44–1.00)
GFR, Estimated: >60 mL/min (ref >60)
Glucose, Bld: 155 mg/dL — ABNORMAL HIGH (ref 70–99)
Potassium: 4 mmol/L (ref 3.5–5.1)
Sodium: 131 mmol/L — ABNORMAL LOW (ref 135–145)
Total Bilirubin: 0.6 mg/dL (ref 0.3–1.2)
Total Protein: 7 g/dL (ref 6.5–8.1)

## 2021-03-25 LAB — CBC WITH DIFFERENTIAL/PLATELET
Abs Immature Granulocytes: 0.06 10*3/uL (ref 0.00–0.07)
Basophils Absolute: 0 10*3/uL (ref 0.0–0.1)
Basophils Relative: 0 %
Eosinophils Absolute: 0.4 10*3/uL (ref 0.0–0.5)
Eosinophils Relative: 3 %
HCT: 35.2 % — ABNORMAL LOW (ref 36.0–46.0)
Hemoglobin: 11.5 g/dL — ABNORMAL LOW (ref 12.0–15.0)
Immature Granulocytes: 1 %
Lymphocytes Relative: 13 %
Lymphs Abs: 1.6 10*3/uL (ref 0.7–4.0)
MCH: 27.6 pg (ref 26.0–34.0)
MCHC: 32.7 g/dL (ref 30.0–36.0)
MCV: 84.4 fL (ref 80.0–100.0)
Monocytes Absolute: 0.9 10*3/uL (ref 0.1–1.0)
Monocytes Relative: 7 %
Neutro Abs: 9.4 10*3/uL — ABNORMAL HIGH (ref 1.7–7.7)
Neutrophils Relative %: 76 %
Platelets: 332 10*3/uL (ref 150–400)
RBC: 4.17 MIL/uL (ref 3.87–5.11)
RDW: 13.8 % (ref 11.5–15.5)
WBC: 12.4 10*3/uL — ABNORMAL HIGH (ref 4.0–10.5)
nRBC: 0 % (ref 0.0–0.2)

## 2021-03-25 LAB — GLUCOSE, CAPILLARY
Glucose-Capillary: 141 mg/dL — ABNORMAL HIGH (ref 70–99)
Glucose-Capillary: 108 mg/dL — ABNORMAL HIGH (ref 70–99)
Glucose-Capillary: 128 mg/dL — ABNORMAL HIGH (ref 70–99)
Glucose-Capillary: 242 mg/dL — ABNORMAL HIGH (ref 70–99)

## 2021-03-25 MED ORDER — CYCLOBENZAPRINE HCL 10 MG PO TABS
10.0000 mg | ORAL_TABLET | Freq: Three times a day (TID) | ORAL | Status: DC
  Administered 2021-03-25 (×3): 10 mg via ORAL
  Filled 2021-03-25 (×3): qty 1

## 2021-03-25 MED ORDER — AMLODIPINE BESYLATE 5 MG PO TABS
5.0000 mg | ORAL_TABLET | Freq: Every day | ORAL | Status: DC
  Administered 2021-03-25 – 2021-03-27 (×2): 5 mg via ORAL
  Filled 2021-03-25 (×2): qty 1

## 2021-03-25 MED ORDER — IOHEXOL 9 MG/ML PO SOLN
500.0000 mL | ORAL | Status: AC
  Administered 2021-03-25 (×2): 500 mL via ORAL

## 2021-03-25 MED ORDER — KETOROLAC TROMETHAMINE 30 MG/ML IJ SOLN
30.0000 mg | Freq: Four times a day (QID) | INTRAMUSCULAR | Status: DC
  Administered 2021-03-25 – 2021-03-27 (×7): 30 mg via INTRAVENOUS
  Filled 2021-03-25 (×6): qty 1

## 2021-03-25 MED ORDER — HYDROMORPHONE HCL 1 MG/ML IJ SOLN
0.5000 mg | INTRAMUSCULAR | Status: DC | PRN
  Administered 2021-03-25: 0.5 mg via INTRAVENOUS
  Filled 2021-03-25: qty 0.5

## 2021-03-25 MED ORDER — IOHEXOL 300 MG/ML  SOLN
100.0000 mL | Freq: Once | INTRAMUSCULAR | Status: AC | PRN
  Administered 2021-03-25: 100 mL via INTRAVENOUS

## 2021-03-25 MED ORDER — HYDRALAZINE HCL 20 MG/ML IJ SOLN
10.0000 mg | INTRAMUSCULAR | Status: DC | PRN
  Administered 2021-03-25 (×2): 10 mg via INTRAVENOUS
  Filled 2021-03-25 (×2): qty 1

## 2021-03-25 MED ORDER — PREGABALIN 50 MG PO CAPS
100.0000 mg | ORAL_CAPSULE | Freq: Three times a day (TID) | ORAL | Status: DC
  Administered 2021-03-25 – 2021-03-27 (×5): 100 mg via ORAL
  Filled 2021-03-25 (×5): qty 2

## 2021-03-25 NOTE — Progress Notes (Signed)
Patient ID: Beth Savage, female   DOB: November 03, 1975, 46 y.o.   MRN: [id number]     [facility] Day(s): 4.   Interval History: Patient seen and examined, no acute events or new complaints overnight. Patient reports having a lot of pain in the left side of the abdomen.  Pain today is 10 out of 10.  Yesterday she was feeling better but today the pain has been getting worse.  The pain is mainly on the left side of the abdomen.  She feels that is gas but even after passing gas she continue with the pain.  She had a bowel movement after enema yesterday.  Still not able to have any other bowel movement.  Vital signs in last 24 hours: [min-max] current  Temp:  [97.7 F (36.5 C)-98.7 F (37.1 C)] 97.7 F (36.5 C) (02/25 0746) Pulse Rate:  [78-98] 78 (02/25 0746) Resp:  [16-20] 20 (02/25 0746) BP: (167-196)/(90-104) 196/104 (02/25 0746) SpO2:  [97 %-100 %] 97 % (02/25 0746)     Height: 5\' 2"  (157.5 cm) Weight: 102.1 kg BMI (Calculated): 41.14   Physical Exam:  Constitutional: alert, cooperative and no distress  Respiratory: breathing non-labored at rest  Cardiovascular: regular rate and sinus rhythm  Gastrointestinal: soft, tender, and non-distended  Labs:  CBC Latest Ref Rng & Units 03/25/2021 03/24/2021 03/23/2021  WBC 4.0 - 10.5 K/uL 12.4(H) 10.1 13.3(H)  Hemoglobin 12.0 - 15.0 g/dL 11.5(L) 10.9(L) 11.7(L)  Hematocrit 36.0 - 46.0 % 35.2(L) 34.1(L) 35.8(L)  Platelets 150 - 400 K/uL 332 307 366   CMP Latest Ref Rng & Units 03/25/2021 03/24/2021 03/23/2021  Glucose 70 - 99 mg/dL 155(H) 159(H) 128(H)  BUN 6 - 20 mg/dL 9 7 8   Creatinine 0.44 - 1.00 mg/dL 0.60 0.41(L) 0.52  Sodium 135 - 145 mmol/L 131(L) 134(L) 135  Potassium 3.5 - 5.1 mmol/L 4.0 3.8 3.9  Chloride 98 - 111 mmol/L 94(L) 96(L) 98  CO2 22 - 32 mmol/L 26 30 27   Calcium 8.9 - 10.3 mg/dL 8.5(L) 8.1(L) 8.1(L)  Total Protein 6.5 - 8.1 g/dL 7.0 6.7 6.7  Total Bilirubin 0.3 - 1.2 mg/dL 0.6 0.7 0.8  Alkaline Phos  38 - 126 U/L 107 89 84  AST 15 - 41 U/L 15 14(L) 20  ALT 0 - 44 U/L 28 36 56(H)    Imaging studies: No new pertinent imaging studies   Assessment/Plan:  46 y.o. female with choledocholithiasis and acute cholecystitis 4 Day Post-Op s/p ERCP and robotic assisted laparoscopic cholecystectomy.   Worsening abdominal pain.  We will start with CT scan of the abdomen and pelvis for evaluation of intra-abdominal collection or any of the other cause that can explain her abdominal pain.  If CT scan is negative we will continue with bowel regimen.  If CT scan is positive for intra-abdominal collection, we will consider doing an MRCP for evaluation of biliary duct.  I adjusted her pain medication adding a muscle relaxer and increasing Toradol to try to avoid opiates.  I will follow-up closely.  Arnold Long, MD

## 2021-03-25 NOTE — Progress Notes (Signed)
PROGRESS NOTE    Beth Savage  VZD:[id number] DOB: 02-28-1975 DOA: 03/21/2021 PCP: Simona Huh, NP    Brief Narrative:  46 y.o. female with medical history significant of diabetes mellitus, hypothyroidism, PCOS, anxiety and depression presented to ED with abdominal pain, nausea and vomiting since Saturday.  Pain is progressively worsening, mostly in right upper quadrant, radiates to her back, increase by applying pressure and has not noticed any relieving factors. Pain was associated with nausea and vomiting, unable to keep anything down.  Denies any fever or chills.  Appetite decreased.  No diarrhea, no bowel movement for the past 2 days stating that there was nothing in my belly.  No urinary symptoms.  Clinical concern for choledocholithiasis.  Completed ERCP.  Biliary sludge noted.  Cystic duct stone noted biliary sphincterotomy performed.  Laparoscopic cholecystectomy performed same day.  Patient tolerated procedure reasonably well.  Has some postoperative pain is to be expected on postoperative day #1.  Patient having persistent pain and bloating she attributes to gas.  Had BM after enema on 2/24.  Felt better after BM however following morning increasing pain mainly on the left side.  Case discussed with general surgery.  Will order repeat CT abdomen to assess for possible fluid collection.  JP drain in place and continues to drain biliary fluid.  Drain output has been decreasing daily.  Concern for fluid collection in abdomen which CT will be able to detect  Assessment & Plan:   Principal Problem:   Cholecystitis, acute with cholelithiasis Active Problems:   Abnormal MRI, liver  Right upper quadrant pain Intractable nausea and vomiting Cystic duct stone Cholecystitis Patient with some significant postoperative pain This to be expected Discussed with general surgery Pain control is been a challenge Patient did have BM on 2/24 Plan: Multimodal pain control including as  needed Tylenol, scheduled Toradol, as needed oxycodone Minimize Dilaudid use Increase Toradol and add muscle relaxant in effort to decrease narcotics As needed antiemetics Soft diet as tolerated Continue with bowel regimen Ambulate patient, out of bed to chair Repeat CT abdomen per general surgery  Type 2 diabetes mellitus Home regimen of metformin and Ozempic Currently on sliding scale  Hypothyroidism PTA Synthroid  Anxiety and depression PTA Xanax Patient was reluctant to take Xanax for fear that it would worsen her constipation.   Okay for low-dose in setting of acute anxiety and acute illness  Morbid obesity BMI 41.15 Counseled patient This complicates overall care and prognosis    DVT prophylaxis: SQ Lovenox Code Status: Full Family Communication: Daughter at bedside Disposition Plan: Status is: Inpatient  Remains inpatient appropriate because:  Postop day #4 status post laparoscopic cholecystectomy.  Still with postoperative pain.  General surgery following.  Repeat CT abdomen pending.   Level of care: Med-Surg  Consultants:  GI General surgery  Procedures:  ERCP 2/21 Laparoscopic cholecystectomy 2/21  Antimicrobials: Zosyn   Subjective: Seen and examined.  Complains of left-sided abdominal pain.  Also attributes to gas.  Objective: Vitals:   03/24/21 2158 03/25/21 0403 03/25/21 0746 03/25/21 1000  BP: (!) 167/90 (!) 169/93 (!) 196/104 (!) 178/101  Pulse: 80 80 78 93  Resp: 17 20 20    Temp: 98.7 F (37.1 C) 98.1 F (36.7 C) 97.7 F (36.5 C)   TempSrc: Oral Oral Oral   SpO2: 100% 97% 97% 98%  Weight:      Height:        Intake/Output Summary (Last 24 hours) at 03/25/2021 1056 Last data filed at  03/24/2021 2349 Gross per 24 hour  Intake --  Output 120 ml  Net -120 ml   Filed Weights   03/20/21 2027 03/21/21 1348  Weight: 99.8 kg 102.1 kg    Examination:  General exam: Distress due to pain Respiratory system: Clear to auscultation.  Respiratory effort normal. Cardiovascular system: S1-S2, RRR, no murmurs, no pedal edema Gastrointestinal system: Obese, left-sided TTP, normal bowel sounds, JP drain in place Central nervous system: Alert and oriented. No focal neurological deficits. Extremities: Symmetric 5 x 5 power. Skin: No rashes, lesions or ulcers Psychiatry: Judgement and insight appear normal. Mood & affect appropriate.     Data Reviewed: I have personally reviewed following labs and imaging studies  CBC: Recent Labs  Lab 03/20/21 2030 03/22/21 0516 03/23/21 0515 03/24/21 0514 03/25/21 0812  WBC 11.9* 12.8* 13.3* 10.1 12.4*  NEUTROABS 8.3*  --  9.8* 7.4 9.4*  HGB 12.8 11.9* 11.7* 10.9* 11.5*  HCT 40.2 37.2 35.8* 34.1* 35.2*  MCV 84.6 84.4 84.4 84.8 84.4  PLT 411* 362 366 307 387   Basic Metabolic Panel: Recent Labs  Lab 03/20/21 2030 03/22/21 0516 03/23/21 0515 03/24/21 0514 03/25/21 0716  NA 134* 135 135 134* 131*  K 3.9 4.3 3.9 3.8 4.0  CL 99 99 98 96* 94*  CO2 26 26 27 30 26   GLUCOSE 115* 119* 128* 159* 155*  BUN 8 7 8 7 9   CREATININE 0.57 0.57 0.52 0.41* 0.60  CALCIUM 8.8* 8.3* 8.1* 8.1* 8.5*   GFR: Estimated Creatinine Clearance: 100.4 mL/min (by C-G formula based on SCr of 0.6 mg/dL). Liver Function Tests: Recent Labs  Lab 03/20/21 2030 03/22/21 0516 03/23/21 0515 03/24/21 0514 03/25/21 0716  AST 23 66* 20 14* 15  ALT 18 96* 56* 36 28  ALKPHOS 54 95 84 89 107  BILITOT 0.6 0.6 0.8 0.7 0.6  PROT 7.5 6.9 6.7 6.7 7.0  ALBUMIN 3.8 3.3* 3.2* 2.9* 3.1*   Recent Labs  Lab 03/20/21 2030 03/22/21 0516  LIPASE 40 25   No results for input(s): AMMONIA in the last 168 hours. Coagulation Profile: Recent Labs  Lab 03/22/21 0516  INR 1.1   Cardiac Enzymes: No results for input(s): CKTOTAL, CKMB, CKMBINDEX, TROPONINI in the last 168 hours. BNP (last 3 results) No results for input(s): PROBNP in the last 8760 hours. HbA1C: No results for input(s): HGBA1C in the last 72  hours.  CBG: Recent Labs  Lab 03/24/21 0800 03/24/21 1147 03/24/21 1625 03/24/21 2344 03/25/21 0747  GLUCAP 118* 185* 146* 137* 141*   Lipid Profile: No results for input(s): CHOL, HDL, LDLCALC, TRIG, CHOLHDL, LDLDIRECT in the last 72 hours. Thyroid Function Tests: No results for input(s): TSH, T4TOTAL, FREET4, T3FREE, THYROIDAB in the last 72 hours.  Anemia Panel: No results for input(s): VITAMINB12, FOLATE, FERRITIN, TIBC, IRON, RETICCTPCT in the last 72 hours. Sepsis Labs: No results for input(s): PROCALCITON, LATICACIDVEN in the last 168 hours.  Recent Results (from the past 240 hour(s))  Resp Panel by RT-PCR (Flu A&B, Covid) Nasopharyngeal Swab     Status: None   Collection Time: 03/20/21  8:30 PM   Specimen: Nasopharyngeal Swab; Nasopharyngeal(NP) swabs in vial transport medium  Result Value Ref Range Status   SARS Coronavirus 2 by RT PCR NEGATIVE NEGATIVE Final    Comment: (NOTE) SARS-CoV-2 target nucleic acids are NOT DETECTED.  The SARS-CoV-2 RNA is generally detectable in upper respiratory specimens during the acute phase of infection. The lowest concentration of SARS-CoV-2 viral copies this assay  can detect is 138 copies/mL. A negative result does not preclude SARS-Cov-2 infection and should not be used as the sole basis for treatment or other patient management decisions. A negative result may occur with  improper specimen collection/handling, submission of specimen other than nasopharyngeal swab, presence of viral mutation(s) within the areas targeted by this assay, and inadequate number of viral copies(<138 copies/mL). A negative result must be combined with clinical observations, patient history, and epidemiological information. The expected result is Negative.  Fact Sheet for Patients:  EntrepreneurPulse.com.au  Fact Sheet for Healthcare Providers:  IncredibleEmployment.be  This test is no t yet approved or cleared  by the [place] FDA and  has been authorized for detection and/or diagnosis of SARS-CoV-2 by FDA under an Emergency Use Authorization (EUA). This EUA will remain  in effect (meaning this test can be used) for the duration of the COVID-19 declaration under Section 564(b)(1) of the Act, 21 U.S.C.section 360bbb-3(b)(1), unless the authorization is terminated  or revoked sooner.       Influenza A by PCR NEGATIVE NEGATIVE Final   Influenza B by PCR NEGATIVE NEGATIVE Final    Comment: (NOTE) The Xpert Xpress SARS-CoV-2/FLU/RSV plus assay is intended as an aid in the diagnosis of influenza from Nasopharyngeal swab specimens and should not be used as a sole basis for treatment. Nasal washings and aspirates are unacceptable for Xpert Xpress SARS-CoV-2/FLU/RSV testing.  Fact Sheet for Patients: EntrepreneurPulse.com.au  Fact Sheet for Healthcare Providers: IncredibleEmployment.be  This test is not yet approved or cleared by the [place] FDA and has been authorized for detection and/or diagnosis of SARS-CoV-2 by FDA under an Emergency Use Authorization (EUA). This EUA will remain in effect (meaning this test can be used) for the duration of the COVID-19 declaration under Section 564(b)(1) of the Act, 21 U.S.C. section 360bbb-3(b)(1), unless the authorization is terminated or revoked.  Performed at [facility], [address], [place], [place] [address]          Radiology Studies: No results found.      Scheduled Meds:  cyclobenzaprine  10 mg Oral TID   enoxaparin (LOVENOX) injection  0.5 mg/kg Subcutaneous Q24H   insulin aspart  0-15 Units Subcutaneous TID WC   insulin aspart  0-5 Units Subcutaneous QHS   iohexol  500 mL Oral Q1 Hr x 2   ketorolac  30 mg Intravenous Q6H   levothyroxine  250 mcg Oral Q0600   senna-docusate  1 tablet Oral BID   simethicone  80 mg Oral QID   Continuous Infusions:  lactated ringers  100 mL/hr at 03/24/21 0246   piperacillin-tazobactam (ZOSYN)  IV 3.375 g (03/25/21 0620)     LOS: 4 days        Sidney Ace, MD [facility]   If 7PM-7AM, please contact night-coverage  03/25/2021, 10:56 AM

## 2021-03-25 NOTE — Consult Note (Addendum)
GYNECOLOGY CONSULT NOTE  Reason for Consult:Persistent post-operative pain, left adnexal mass Referring Physician: Ralene Muskrat, MD (Hospitalist)  HPI:  Beth Savage is a 46 y.o. G25P1011 female with a h/o PCOS, DM, and hypothyroidism who is currently admitted to the hospital with choledocholithiasis and acute cholecystitis 4 days post-op s/p ERCP and robotic assisted laparoscopic cholecystectomy.  Patient has continued to experience worsening abdominal pain after her surgery (mostly left sided), and CT scan performed which revealed 2 left adnexal masses (4.5 cm and 12 cm in size).  Patient reports that since her surgery she has had difficulties with having a bowel movement.  She has had stool softeners and laxatives, even receiving an enema. Finally had a BM yesterday, but nothing since then. Feel as though she has a "large gas bubble that just won't pass".   Patient reports that she has not seen a GYN in several years, since her last pregnancy.   Pertinent Gynecological History: Patient's last menstrual period was 03/05/2021. Menses:  irregular, with occasional skipped cycles Contraception: OCP (estrogen/progesterone), although notes she is not always consistent. Sexually transmitted diseases: no past history Previous GYN Procedures:  None   Last mammogram:  patient has not had one   Last pap: has been "several years"      Past Medical History:  Diagnosis Date   Anxiety and depression    Diabetes mellitus without complication (HCC)    Gallstone    Hypothyroidism    PCOS (polycystic ovarian syndrome)     Past Surgical History:  Procedure Laterality Date   ERCP N/A 03/21/2021   Procedure: ENDOSCOPIC RETROGRADE CHOLANGIOPANCREATOGRAPHY (ERCP);  Surgeon: Lucilla Lame, MD;  Location: [facility] ENDOSCOPY;  Service: Endoscopy;  Laterality: N/A;   no surgery history      Family History  Problem Relation Age of Onset   COPD Mother    Heart disease Mother    Rheum  arthritis Maternal Grandmother    Asthma Daughter    Eczema Daughter     Social History:  reports that she has been smoking cigarettes. She has been smoking an average of .25 packs per day. She has never used smokeless tobacco. She reports that she does not currently use alcohol after a past usage of about 2.0 standard drinks per week. She reports current drug use. Drug: Marijuana.  Allergies: No Known Allergies  Medications: Prior to Admission:  Medications Prior to Admission  Medication Sig Dispense Refill Last Dose   ALPRAZolam (XANAX) 1 MG tablet Take 1 mg by mouth 3 (three) times daily as needed.   Past Week   Ferrous Sulfate (IRON PO) Take by mouth once a week.   Past Week   ISIBLOOM 0.15-30 MG-MCG tablet Take 1 tablet by mouth daily.   Past Week   levothyroxine (SYNTHROID) 200 MCG tablet Take 200 mcg by mouth daily.   Past Week   levothyroxine (SYNTHROID) 50 MCG tablet Take 50 mcg by mouth daily.   Past Week   MAGNESIUM PO Take by mouth.   Past Week   metFORMIN (GLUCOPHAGE) 500 MG tablet Take 500 mg by mouth daily.   Past Week   OZEMPIC, 1 MG/DOSE, 4 MG/3ML SOPN Inject 1 mg as directed once a week.   Past Week   phentermine (ADIPEX-P) 37.5 MG tablet Take 56.25 mg by mouth daily.   03/20/2021   phentermine 37.5 MG capsule Take 37.5 mg by mouth daily.   03/20/2021   TRINTELLIX 10 MG TABS tablet Take 10 mg by  mouth daily.   03/20/2021   Vitamin D, Ergocalciferol, (DRISDOL) 1.25 MG (50000 UT) CAPS capsule Take 50,000 Units by mouth once a week.   03/20/2021   ALPRAZolam (XANAX) 0.5 MG tablet Take 0.5 mg by mouth 2 (two) times daily. (Patient not taking: Reported on 03/21/2021)   Not Taking   CONTRAVE 8-90 MG TB12 Take by mouth.      Desogestrel-Ethinyl Estradiol (ISIBLOOM PO) Take by mouth.      Scheduled:  amLODipine  5 mg Oral Daily   cyclobenzaprine  10 mg Oral TID   enoxaparin (LOVENOX) injection  0.5 mg/kg Subcutaneous Q24H   insulin aspart  0-15 Units Subcutaneous TID WC    insulin aspart  0-5 Units Subcutaneous QHS   ketorolac  30 mg Intravenous Q6H   levothyroxine  250 mcg Oral Q0600   pregabalin  100 mg Oral TID   senna-docusate  1 tablet Oral BID   simethicone  80 mg Oral QID   Continuous:  lactated ringers 100 mL/hr at 03/25/21 2150   piperacillin-tazobactam (ZOSYN)  IV 3.375 g (03/25/21 2149)      Review of Systems  Constitutional:  Negative for appetite change, chills, fever and unexpected weight change.  HENT: Negative.    Respiratory: Negative.    Cardiovascular: Negative.   Gastrointestinal:  Positive for abdominal pain and constipation. Negative for abdominal distention, diarrhea, nausea and vomiting.  Endocrine: Negative for cold intolerance, heat intolerance and polydipsia.       Hair loss  Genitourinary:  Negative for dysuria, flank pain, menstrual problem and vaginal bleeding.  Musculoskeletal: Negative.   Skin: Negative.   Neurological: Negative.   Hematological: Negative.   Psychiatric/Behavioral:  Positive for dysphoric mood. Negative for agitation and hallucinations. The patient is not nervous/anxious.    Blood pressure (!) 193/108, pulse 86, temperature 98.6 F (37 C), temperature source Oral, resp. rate 20, height 5\' 2"  (1.575 m), weight 102.1 kg, last menstrual period 03/05/2021, SpO2 95 %, unknown if currently breastfeeding. Physical Exam Constitutional:      General: She is not in acute distress.    Appearance: She is well-developed. She is obese. She is not ill-appearing.  HENT:     Head: Normocephalic and atraumatic.  Eyes:     Extraocular Movements: Extraocular movements intact.     Pupils: Pupils are equal, round, and reactive to light.  Cardiovascular:     Rate and Rhythm: Normal rate and regular rhythm.     Heart sounds: No murmur heard. Pulmonary:     Effort: Pulmonary effort is normal.     Breath sounds: Normal breath sounds.  Abdominal:     General: Abdomen is flat. A surgical scar is present. Bowel sounds  are decreased.     Palpations: Abdomen is soft. There is no mass.     Tenderness: There is abdominal tenderness in the left lower quadrant. There is no guarding or rebound.     Comments: Right laparoscopic incision site with JP drain, ~ 20 m of serosanguinous fluid noted. All other incision sites healing well, Dermabond in place.   Neurological:     Mental Status: She is alert.    Results for orders placed or performed during the hospital encounter of 03/21/21 (from the past 48 hour(s))  Glucose, capillary     Status: Abnormal   Collection Time: 03/23/21  8:26 PM  Result Value Ref Range   Glucose-Capillary 107 (H) 70 - 99 mg/dL    Comment: Glucose reference range applies only to  samples taken after fasting for at least 8 hours.  Glucose, capillary     Status: Abnormal   Collection Time: 03/23/21 11:34 PM  Result Value Ref Range   Glucose-Capillary 105 (H) 70 - 99 mg/dL    Comment: Glucose reference range applies only to samples taken after fasting for at least 8 hours.  Glucose, capillary     Status: Abnormal   Collection Time: 03/24/21  3:29 AM  Result Value Ref Range   Glucose-Capillary 128 (H) 70 - 99 mg/dL    Comment: Glucose reference range applies only to samples taken after fasting for at least 8 hours.  CBC with Differential/Platelet     Status: Abnormal   Collection Time: 03/24/21  5:14 AM  Result Value Ref Range   WBC 10.1 4.0 - 10.5 K/uL   RBC 4.02 3.87 - 5.11 MIL/uL   Hemoglobin 10.9 (L) 12.0 - 15.0 g/dL   HCT 34.1 (L) 36.0 - 46.0 %   MCV 84.8 80.0 - 100.0 fL   MCH 27.1 26.0 - 34.0 pg   MCHC 32.0 30.0 - 36.0 g/dL   RDW 13.8 11.5 - 15.5 %   Platelets 307 150 - 400 K/uL   nRBC 0.0 0.0 - 0.2 %   Neutrophils Relative % 73 %   Neutro Abs 7.4 1.7 - 7.7 K/uL   Lymphocytes Relative 17 %   Lymphs Abs 1.7 0.7 - 4.0 K/uL   Monocytes Relative 8 %   Monocytes Absolute 0.8 0.1 - 1.0 K/uL   Eosinophils Relative 2 %   Eosinophils Absolute 0.2 0.0 - 0.5 K/uL   Basophils  Relative 0 %   Basophils Absolute 0.0 0.0 - 0.1 K/uL   Immature Granulocytes 0 %   Abs Immature Granulocytes 0.02 0.00 - 0.07 K/uL    Comment: Performed at [facility], [address] [place], [place] [address]  Comprehensive metabolic panel     Status: Abnormal   Collection Time: 03/24/21  5:14 AM  Result Value Ref Range   Sodium 134 (L) 135 - 145 mmol/L   Potassium 3.8 3.5 - 5.1 mmol/L   Chloride 96 (L) 98 - 111 mmol/L   CO2 30 22 - 32 mmol/L   Glucose, Bld 159 (H) 70 - 99 mg/dL    Comment: Glucose reference range applies only to samples taken after fasting for at least 8 hours.   BUN 7 6 - 20 mg/dL   Creatinine, Ser 0.41 (L) 0.44 - 1.00 mg/dL   Calcium 8.1 (L) 8.9 - 10.3 mg/dL   Total Protein 6.7 6.5 - 8.1 g/dL   Albumin 2.9 (L) 3.5 - 5.0 g/dL   AST 14 (L) 15 - 41 U/L   ALT 36 0 - 44 U/L   Alkaline Phosphatase 89 38 - 126 U/L   Total Bilirubin 0.7 0.3 - 1.2 mg/dL   GFR, Estimated >60 >60 mL/min    Comment: (NOTE) Calculated using the CKD-EPI Creatinine Equation (2021)    Anion gap 8 5 - 15    Comment: Performed at [facility], [address] [place], [place] [address]  Glucose, capillary     Status: Abnormal   Collection Time: 03/24/21  8:00 AM  Result Value Ref Range   Glucose-Capillary 118 (H) 70 - 99 mg/dL    Comment: Glucose reference range applies only to samples taken after fasting for at least 8 hours.   Comment 1 Notify RN    Comment 2 Document in Chart   Glucose, capillary  Status: Abnormal   Collection Time: 03/24/21 11:47 AM  Result Value Ref Range   Glucose-Capillary 185 (H) 70 - 99 mg/dL    Comment: Glucose reference range applies only to samples taken after fasting for at least 8 hours.   Comment 1 Notify RN    Comment 2 Document in Chart   Glucose, capillary     Status: Abnormal   Collection Time: 03/24/21  4:25 PM  Result Value Ref Range   Glucose-Capillary 146 (H) 70 - 99 mg/dL    Comment: Glucose reference range applies  only to samples taken after fasting for at least 8 hours.  Glucose, capillary     Status: Abnormal   Collection Time: 03/24/21 11:44 PM  Result Value Ref Range   Glucose-Capillary 137 (H) 70 - 99 mg/dL    Comment: Glucose reference range applies only to samples taken after fasting for at least 8 hours.  Comprehensive metabolic panel     Status: Abnormal   Collection Time: 03/25/21  7:16 AM  Result Value Ref Range   Sodium 131 (L) 135 - 145 mmol/L   Potassium 4.0 3.5 - 5.1 mmol/L   Chloride 94 (L) 98 - 111 mmol/L   CO2 26 22 - 32 mmol/L   Glucose, Bld 155 (H) 70 - 99 mg/dL    Comment: Glucose reference range applies only to samples taken after fasting for at least 8 hours.   BUN 9 6 - 20 mg/dL   Creatinine, Ser 0.60 0.44 - 1.00 mg/dL   Calcium 8.5 (L) 8.9 - 10.3 mg/dL   Total Protein 7.0 6.5 - 8.1 g/dL   Albumin 3.1 (L) 3.5 - 5.0 g/dL   AST 15 15 - 41 U/L   ALT 28 0 - 44 U/L   Alkaline Phosphatase 107 38 - 126 U/L   Total Bilirubin 0.6 0.3 - 1.2 mg/dL   GFR, Estimated >60 >60 mL/min    Comment: (NOTE) Calculated using the CKD-EPI Creatinine Equation (2021)    Anion gap 11 5 - 15    Comment: Performed at [facility], [address] [place], [place] [address]  Glucose, capillary     Status: Abnormal   Collection Time: 03/25/21  7:47 AM  Result Value Ref Range   Glucose-Capillary 141 (H) 70 - 99 mg/dL    Comment: Glucose reference range applies only to samples taken after fasting for at least 8 hours.  CBC with Differential/Platelet     Status: Abnormal   Collection Time: 03/25/21  8:12 AM  Result Value Ref Range   WBC 12.4 (H) 4.0 - 10.5 K/uL   RBC 4.17 3.87 - 5.11 MIL/uL   Hemoglobin 11.5 (L) 12.0 - 15.0 g/dL   HCT 35.2 (L) 36.0 - 46.0 %   MCV 84.4 80.0 - 100.0 fL   MCH 27.6 26.0 - 34.0 pg   MCHC 32.7 30.0 - 36.0 g/dL   RDW 13.8 11.5 - 15.5 %   Platelets 332 150 - 400 K/uL   nRBC 0.0 0.0 - 0.2 %   Neutrophils Relative % 76 %   Neutro Abs 9.4 (H) 1.7 - 7.7  K/uL   Lymphocytes Relative 13 %   Lymphs Abs 1.6 0.7 - 4.0 K/uL   Monocytes Relative 7 %   Monocytes Absolute 0.9 0.1 - 1.0 K/uL   Eosinophils Relative 3 %   Eosinophils Absolute 0.4 0.0 - 0.5 K/uL   Basophils Relative 0 %   Basophils Absolute 0.0 0.0 - 0.1 K/uL  Immature Granulocytes 1 %   Abs Immature Granulocytes 0.06 0.00 - 0.07 K/uL    Comment: Performed at [facility], [address] [place], [place] [address]  Glucose, capillary     Status: Abnormal   Collection Time: 03/25/21 11:59 AM  Result Value Ref Range   Glucose-Capillary 108 (H) 70 - 99 mg/dL    Comment: Glucose reference range applies only to samples taken after fasting for at least 8 hours.  Glucose, capillary     Status: Abnormal   Collection Time: 03/25/21  5:10 PM  Result Value Ref Range   Glucose-Capillary 128 (H) 70 - 99 mg/dL    Comment: Glucose reference range applies only to samples taken after fasting for at least 8 hours.    CT ABDOMEN PELVIS W CONTRAST  Result Date: 03/25/2021 CLINICAL DATA:  Postop abdominal pain. Choledocholithiasis and acute cholecystitis. Four days postop from ERCP and robotic assisted laparoscopic cholecystectomy. Worsening pain. EXAM: CT ABDOMEN AND PELVIS WITH CONTRAST TECHNIQUE: Multidetector CT imaging of the abdomen and pelvis was performed using the standard protocol following bolus administration of intravenous contrast. RADIATION DOSE REDUCTION: This exam was performed according to the departmental dose-optimization program which includes automated exposure control, adjustment of the mA and/or kV according to patient size and/or use of iterative reconstruction technique. CONTRAST:  130mL OMNIPAQUE IOHEXOL 300 MG/ML  SOLN COMPARISON:  MRI of the abdomen on 03/21/2021 FINDINGS: Lower chest: There is bibasilar atelectasis, RIGHT greater than LEFT. Heart size is normal. Hepatobiliary: There is focal fatty infiltration adjacent to the falciform ligament. Liver is  homogeneous. Previous cholecystectomy. The gallbladder fossa contains a trace amount of fluid but no air. Small amount of fluid tracks from the gallbladder fossa and is seen adjacent to the descending portion of the duodenum. No associated gas or evidence for abscess in this region. Common bile duct is normal in appearance. No radiopaque calculi identified within the biliary tree. No biliary duct dilatation. Pancreas: Unremarkable. No pancreatic ductal dilatation or surrounding inflammatory changes. Spleen: Normal in size without focal abnormality. Adrenals/Urinary Tract: Adrenal glands are normal. Kidneys and ureters are unremarkable. The bladder and visualized portion of the urethra are normal. Stomach/Bowel: Stomach is unremarkable. Small amount of fluid around the descending duodenum. Otherwise, small bowel loops are normal in appearance. Appendix is normal. Large bowel is decompressed with rare colonic diverticula. No acute inflammatory changes. RIGHT LOWER QUADRANT surgical drain is in place, not associated with fluid collection. Vascular/Lymphatic: No significant vascular findings are present. No enlarged abdominal or pelvic lymph nodes. Reproductive: The uterus is present. RIGHT ovary has a normal CT appearance. There are 2 low-attenuation structures in the LEFT adnexal regions measuring 4.5 x 3.8 centimeters and 12.0 x 8.2 x 11.0 Centimeters. Further characterization of these adnexal cyst is needed. Other: Abdominal wall is unremarkable. No ascites. No free intraperitoneal air. Musculoskeletal: No acute or significant osseous findings. IMPRESSION: 1. Large LEFT adnexal cystic masses measuring 4.5 and 12.0 centimeters. Further characterization with transabdominal and endovaginal pelvic ultrasound recommended. 2. Interval cholecystectomy. Trace amount of fluid in the gallbladder fossa and surrounding the descending portion of the duodenum are likely postoperative. No associated air or evidence for abscess in  these regions. No radiopaque calculi or evidence for biliary tract dilatation. 3. Unremarkable course of the RIGHT LOWER QUADRANT drain, without surrounding fluid. 4. Hepatic steatosis. 5. Minimal colonic diverticulosis without acute diverticulitis. 6. RIGHT ovary has a normal CT appearance. Electronically Signed   By: Nolon Nations M.D.   On:  03/25/2021 14:33    Assessment/Plan: Left adnexal mass  - Discussion had regarding CT scan findings of left adnexal cystic masses. Most likely secondary to patient's history of PCOS.  Due to size and patient's current complaints of pain, would recommend surgical intervention at this time. The risks of surgery were discussed in detail with the patient including but not limited to: bleeding which may require transfusion or reoperation; infection which may require prolonged hospitalization or repeat hospitalization and antibiotic therapy; injury to bowel, bladder, ureters and major vessels or other surrounding organs which may lead to other procedures; formation of adhesions; need for additional procedures including laparotomy or subsequent procedures secondary to intraoperative injury or abnormal pathology; thromboembolic phenomenon; incisional problems and other postoperative or anesthesia complications.  Advised that plans would be for cystectomy, however also counseled on possibility of oophorectomy based on intraoperative findings. Patient was told that the likelihood that her condition and symptoms will be treated effectively with this surgical management was very high; the postoperative expectations were also discussed in detail. Will schedule procedure for tomorrow morning.  - Ultrasound ordered to further characterize mass, and also rule out possibility of torsion due to patient's worsening pain.  - NPO at midnight.   PCOS - Discussed that after post-operative recovery period, would need to consider some form of hormonal suppression to prevent further cyst  development.   POD#4 s/p ERCP and robotic assisted laparoscopic cholecystectomy - Patient currently on Zosyn post-operatively. Will plan for pre-procedure antibiotics with Ancef tomorrow.  - Discussed limiting use of narcotics if possible as this may exacerbate her constipation.  - Currently being followed by General Surgery.    A total of 60 minutes was  spent face-to-face with the patient during the encounter and over half of that time involved counseling of diagnosis and options, reviewing labs and CT imaging, coordination of care for scheduling surgery.    Rubie Maid. MD [facility] 03/25/2021

## 2021-03-26 ENCOUNTER — Encounter
Admission: EM | Disposition: A | Discharge status: Home / Self Care | Payer: Self-pay | Source: Home / Self Care | Attending: Internal Medicine

## 2021-03-26 ENCOUNTER — Other Ambulatory Visit: Payer: Self-pay

## 2021-03-26 ENCOUNTER — Inpatient Hospital Stay: Payer: BC Managed Care – PPO | Admitting: Anesthesiology

## 2021-03-26 DIAGNOSIS — R52 Pain, unspecified: Secondary | ICD-10-CM

## 2021-03-26 DIAGNOSIS — R1030 Lower abdominal pain, unspecified: Secondary | ICD-10-CM | POA: Diagnosis not present

## 2021-03-26 DIAGNOSIS — K8000 Calculus of gallbladder with acute cholecystitis without obstruction: Secondary | ICD-10-CM | POA: Diagnosis not present

## 2021-03-26 DIAGNOSIS — N838 Other noninflammatory disorders of ovary, fallopian tube and broad ligament: Secondary | ICD-10-CM | POA: Diagnosis not present

## 2021-03-26 DIAGNOSIS — E282 Polycystic ovarian syndrome: Secondary | ICD-10-CM | POA: Diagnosis not present

## 2021-03-26 HISTORY — PX: LAPAROSCOPIC UNILATERAL SALPINGECTOMY: SHX5934

## 2021-03-26 LAB — COMPREHENSIVE METABOLIC PANEL
ALT: 25 U/L (ref 0–44)
AST: 14 U/L — ABNORMAL LOW (ref 15–41)
Albumin: 2.9 g/dL — ABNORMAL LOW (ref 3.5–5.0)
Alkaline Phosphatase: 107 U/L (ref 38–126)
Anion gap: 9 (ref 5–15)
BUN: 9 mg/dL (ref 6–20)
CO2: 29 mmol/L (ref 22–32)
Calcium: 8.8 mg/dL — ABNORMAL LOW (ref 8.9–10.3)
Chloride: 95 mmol/L — ABNORMAL LOW (ref 98–111)
Creatinine, Ser: 0.58 mg/dL (ref 0.44–1.00)
GFR, Estimated: >60 mL/min (ref >60)
Glucose, Bld: 132 mg/dL — ABNORMAL HIGH (ref 70–99)
Potassium: 3.9 mmol/L (ref 3.5–5.1)
Sodium: 133 mmol/L — ABNORMAL LOW (ref 135–145)
Total Bilirubin: 0.4 mg/dL (ref 0.3–1.2)
Total Protein: 6.9 g/dL (ref 6.5–8.1)

## 2021-03-26 LAB — CBC WITH DIFFERENTIAL/PLATELET
Abs Immature Granulocytes: 0.05 10*3/uL (ref 0.00–0.07)
Basophils Absolute: 0 10*3/uL (ref 0.0–0.1)
Basophils Relative: 0 %
Eosinophils Absolute: 0.7 10*3/uL — ABNORMAL HIGH (ref 0.0–0.5)
Eosinophils Relative: 5 %
HCT: 36.9 % (ref 36.0–46.0)
Hemoglobin: 12.2 g/dL (ref 12.0–15.0)
Immature Granulocytes: 0 %
Lymphocytes Relative: 17 %
Lymphs Abs: 2.4 10*3/uL (ref 0.7–4.0)
MCH: 27.4 pg (ref 26.0–34.0)
MCHC: 33.1 g/dL (ref 30.0–36.0)
MCV: 82.7 fL (ref 80.0–100.0)
Monocytes Absolute: 1.1 10*3/uL — ABNORMAL HIGH (ref 0.1–1.0)
Monocytes Relative: 8 %
Neutro Abs: 10.1 10*3/uL — ABNORMAL HIGH (ref 1.7–7.7)
Neutrophils Relative %: 70 %
Platelets: 451 10*3/uL — ABNORMAL HIGH (ref 150–400)
RBC: 4.46 MIL/uL (ref 3.87–5.11)
RDW: 14 % (ref 11.5–15.5)
WBC: 14.5 10*3/uL — ABNORMAL HIGH (ref 4.0–10.5)
nRBC: 0 % (ref 0.0–0.2)

## 2021-03-26 LAB — GLUCOSE, CAPILLARY
Glucose-Capillary: 160 mg/dL — ABNORMAL HIGH (ref 70–99)
Glucose-Capillary: 298 mg/dL — ABNORMAL HIGH (ref 70–99)
Glucose-Capillary: 193 mg/dL — ABNORMAL HIGH (ref 70–99)

## 2021-03-26 SURGERY — SALPINGECTOMY, UNILATERAL, LAPAROSCOPIC
Anesthesia: General | Laterality: Left

## 2021-03-26 MED ORDER — DEXAMETHASONE SODIUM PHOSPHATE 10 MG/ML IJ SOLN
INTRAMUSCULAR | Status: DC | PRN
  Administered 2021-03-26: 10 mg via INTRAVENOUS

## 2021-03-26 MED ORDER — BUPIVACAINE HCL 0.5 % IJ SOLN
INTRAMUSCULAR | Status: DC | PRN
Start: 2021-03-26 — End: 2021-03-26
  Administered 2021-03-26: 30 mL

## 2021-03-26 MED ORDER — MEPERIDINE HCL 25 MG/ML IJ SOLN: 6.2500 mg | INTRAMUSCULAR | Status: DC | PRN

## 2021-03-26 MED ORDER — PROPOFOL 10 MG/ML IV BOLUS
INTRAVENOUS | Status: DC | PRN
Start: 2021-03-26 — End: 2021-03-26
  Administered 2021-03-26: 180 mg via INTRAVENOUS

## 2021-03-26 MED ORDER — LACTATED RINGERS IV SOLN: INTRAVENOUS | Status: DC

## 2021-03-26 MED ORDER — ONDANSETRON HCL 4 MG/2ML IJ SOLN: 4.0000 mg | Freq: Once | INTRAMUSCULAR | Status: DC | PRN

## 2021-03-26 MED ORDER — FENTANYL CITRATE (PF) 100 MCG/2ML IJ SOLN
INTRAMUSCULAR | Status: AC
  Filled 2021-03-26: qty 2

## 2021-03-26 MED ORDER — MIDAZOLAM HCL 2 MG/2ML IJ SOLN
INTRAMUSCULAR | Status: AC
  Filled 2021-03-26: qty 2

## 2021-03-26 MED ORDER — DEXAMETHASONE SODIUM PHOSPHATE 10 MG/ML IJ SOLN
INTRAMUSCULAR | Status: AC
  Filled 2021-03-26: qty 1

## 2021-03-26 MED ORDER — PROPOFOL 10 MG/ML IV BOLUS
INTRAVENOUS | Status: AC
  Filled 2021-03-26: qty 20

## 2021-03-26 MED ORDER — LIDOCAINE HCL (PF) 2 % IJ SOLN
INTRAMUSCULAR | Status: AC
  Filled 2021-03-26: qty 5

## 2021-03-26 MED ORDER — SUGAMMADEX SODIUM 200 MG/2ML IV SOLN
INTRAVENOUS | Status: DC | PRN
  Administered 2021-03-26: 200 mg via INTRAVENOUS

## 2021-03-26 MED ORDER — FENTANYL CITRATE (PF) 100 MCG/2ML IJ SOLN
INTRAMUSCULAR | Status: DC | PRN
Start: 2021-03-26 — End: 2021-03-26
  Administered 2021-03-26 (×4): 50 ug via INTRAVENOUS

## 2021-03-26 MED ORDER — ONDANSETRON HCL 4 MG/2ML IJ SOLN
INTRAMUSCULAR | Status: AC
  Filled 2021-03-26: qty 2

## 2021-03-26 MED ORDER — DEXMEDETOMIDINE (PRECEDEX) IN NS 20 MCG/5ML (4 MCG/ML) IV SYRINGE
PREFILLED_SYRINGE | INTRAVENOUS | Status: DC | PRN
  Administered 2021-03-26 (×2): 8 ug via INTRAVENOUS

## 2021-03-26 MED ORDER — DROPERIDOL 2.5 MG/ML IJ SOLN
0.6250 mg | Freq: Once | INTRAMUSCULAR | Status: DC | PRN
  Filled 2021-03-26: qty 2

## 2021-03-26 MED ORDER — HYDROCODONE-ACETAMINOPHEN 7.5-325 MG PO TABS
ORAL_TABLET | ORAL | Status: AC
  Filled 2021-03-26: qty 1

## 2021-03-26 MED ORDER — POLYETHYLENE GLYCOL 3350 17 G PO PACK
17.0000 g | PACK | Freq: Two times a day (BID) | ORAL | Status: DC
  Administered 2021-03-26 – 2021-03-27 (×2): 17 g via ORAL
  Filled 2021-03-26 (×2): qty 1

## 2021-03-26 MED ORDER — FENTANYL CITRATE (PF) 100 MCG/2ML IJ SOLN
INTRAMUSCULAR | Status: AC
  Administered 2021-03-26: 50 ug via INTRAVENOUS
  Filled 2021-03-26: qty 2

## 2021-03-26 MED ORDER — LIDOCAINE HCL (CARDIAC) PF 100 MG/5ML IV SOSY
PREFILLED_SYRINGE | INTRAVENOUS | Status: DC | PRN
Start: 2021-03-26 — End: 2021-03-26
  Administered 2021-03-26: 100 mg via INTRAVENOUS

## 2021-03-26 MED ORDER — MIDAZOLAM HCL 2 MG/2ML IJ SOLN
INTRAMUSCULAR | Status: DC | PRN
  Administered 2021-03-26: 2 mg via INTRAVENOUS

## 2021-03-26 MED ORDER — DEXMEDETOMIDINE (PRECEDEX) IN NS 20 MCG/5ML (4 MCG/ML) IV SYRINGE
PREFILLED_SYRINGE | INTRAVENOUS | Status: AC
  Filled 2021-03-26: qty 5

## 2021-03-26 MED ORDER — HYDROCODONE-ACETAMINOPHEN 7.5-325 MG PO TABS
1.0000 | ORAL_TABLET | Freq: Once | ORAL | Status: AC | PRN
  Administered 2021-03-26: 1 via ORAL

## 2021-03-26 MED ORDER — FENTANYL CITRATE (PF) 100 MCG/2ML IJ SOLN
25.0000 ug | INTRAMUSCULAR | Status: DC | PRN
  Administered 2021-03-26: 50 ug via INTRAVENOUS

## 2021-03-26 MED ORDER — GABAPENTIN 300 MG PO CAPS: 300.0000 mg | ORAL_CAPSULE | ORAL | Status: DC

## 2021-03-26 MED ORDER — METHOCARBAMOL 500 MG PO TABS
500.0000 mg | ORAL_TABLET | Freq: Three times a day (TID) | ORAL | Status: DC
  Administered 2021-03-26 – 2021-03-27 (×3): 500 mg via ORAL
  Filled 2021-03-26 (×3): qty 1

## 2021-03-26 MED ORDER — 0.9 % SODIUM CHLORIDE (POUR BTL) OPTIME
TOPICAL | Status: DC | PRN
  Administered 2021-03-26: 250 mL

## 2021-03-26 MED ORDER — ROCURONIUM BROMIDE 100 MG/10ML IV SOLN
INTRAVENOUS | Status: DC | PRN
  Administered 2021-03-26: 40 mg via INTRAVENOUS
  Administered 2021-03-26: 60 mg via INTRAVENOUS

## 2021-03-26 MED ORDER — CEFAZOLIN SODIUM-DEXTROSE 2-4 GM/100ML-% IV SOLN: 2.0000 g | INTRAVENOUS | Status: DC

## 2021-03-26 MED ORDER — ACETAMINOPHEN 500 MG PO TABS: 1000.0000 mg | ORAL_TABLET | ORAL | Status: DC

## 2021-03-26 MED ORDER — ONDANSETRON HCL 4 MG/2ML IJ SOLN
INTRAMUSCULAR | Status: DC | PRN
  Administered 2021-03-26: 4 mg via INTRAVENOUS

## 2021-03-26 MED ORDER — ROCURONIUM BROMIDE 10 MG/ML (PF) SYRINGE
PREFILLED_SYRINGE | INTRAVENOUS | Status: AC
  Filled 2021-03-26: qty 10

## 2021-03-26 SURGICAL SUPPLY — 43 items
ANCHOR TIS RET SYS 1550ML (BAG) ×1 IMPLANT
BACTOSHIELD CHG 4% 4OZ (MISCELLANEOUS) ×1
BLADE SURG SZ11 CARB STEEL (BLADE) ×2 IMPLANT
CATH ROBINSON RED A/P 16FR (CATHETERS) ×2 IMPLANT
CHLORAPREP W/TINT 26 (MISCELLANEOUS) ×2 IMPLANT
CORD MONOPOLAR M/FML 12FT (MISCELLANEOUS) IMPLANT
DERMABOND ADVANCED (GAUZE/BANDAGES/DRESSINGS) ×1
DERMABOND ADVANCED .7 DNX12 (GAUZE/BANDAGES/DRESSINGS) ×1 IMPLANT
GAUZE 4X4 16PLY ~~LOC~~+RFID DBL (SPONGE) ×4 IMPLANT
GLOVE SURG ENC MOIS LTX SZ6.5 (GLOVE) ×2 IMPLANT
GLOVE SURG ENC MOIS LTX SZ8 (GLOVE) ×2 IMPLANT
GLOVE SURG UNDER LTX SZ7 (GLOVE) ×2 IMPLANT
GOWN STRL REUS W/ TWL LRG LVL3 (GOWN DISPOSABLE) ×2 IMPLANT
GOWN STRL REUS W/TWL LRG LVL3 (GOWN DISPOSABLE) ×2
GOWN STRL REUS W/TWL XL LVL4 (GOWN DISPOSABLE) ×2 IMPLANT
GRASPER SUT TROCAR 14GX15 (MISCELLANEOUS) IMPLANT
IRRIGATION STRYKERFLOW (MISCELLANEOUS) ×1 IMPLANT
IRRIGATOR STRYKERFLOW (MISCELLANEOUS) ×2
IV LACTATED RINGERS 1000ML (IV SOLUTION) ×2 IMPLANT
KIT PINK PAD W/HEAD ARE REST (MISCELLANEOUS) ×2 IMPLANT
KIT PINK PAD W/HEAD ARM REST (MISCELLANEOUS) ×1 IMPLANT
KIT TURNOVER CYSTO (KITS) ×2 IMPLANT
MANIFOLD NEPTUNE II (INSTRUMENTS) ×2 IMPLANT
NS IRRIG 500ML POUR BTL (IV SOLUTION) ×2 IMPLANT
PACK GYN LAPAROSCOPIC (MISCELLANEOUS) ×2 IMPLANT
PAD OB MATERNITY 4.3X12.25 (PERSONAL CARE ITEMS) ×2 IMPLANT
PAD PREP 24X41 OB/GYN DISP (PERSONAL CARE ITEMS) ×2 IMPLANT
POUCH ENDO CATCH 10MM SPEC (MISCELLANEOUS) ×1 IMPLANT
POUCH SPECIMEN RETRIEVAL 10MM (ENDOMECHANICALS) IMPLANT
SCISSORS METZENBAUM CVD 33 (INSTRUMENTS) IMPLANT
SCRUB CHG 4% DYNA-HEX 4OZ (MISCELLANEOUS) ×1 IMPLANT
SET TUBE SMOKE EVAC HIGH FLOW (TUBING) ×2 IMPLANT
SHEARS HARMONIC ACE PLUS 36CM (ENDOMECHANICALS) ×1 IMPLANT
SLEEVE ENDOPATH XCEL 5M (ENDOMECHANICALS) ×2 IMPLANT
SUT VIC AB 3-0 SH 27 (SUTURE)
SUT VIC AB 3-0 SH 27X BRD (SUTURE) IMPLANT
SUT VICRYL 0 AB UR-6 (SUTURE) ×2 IMPLANT
TROCAR BLADELESS 15MM (ENDOMECHANICALS) ×1 IMPLANT
TROCAR ENDO BLADELESS 11MM (ENDOMECHANICALS) ×2 IMPLANT
TROCAR XCEL 12X100 BLDLESS (ENDOMECHANICALS) ×1 IMPLANT
TROCAR XCEL NON-BLD 5MMX100MML (ENDOMECHANICALS) ×2 IMPLANT
TROCAR XCEL UNIV SLVE 11M 100M (ENDOMECHANICALS) IMPLANT
WATER STERILE IRR 500ML POUR (IV SOLUTION) ×2 IMPLANT

## 2021-03-26 NOTE — Anesthesia Postprocedure Evaluation (Signed)
Anesthesia Post Note  Patient: Milayna Rotenberg Shur  Procedure(s) Performed: LAPAROSCOPIC UNILATERAL SALPINGECTOMY WITH FLUID ASPIRATION (Left)  Patient location during evaluation: PACU Anesthesia Type: General Level of consciousness: awake and alert Pain management: pain level controlled Vital Signs Assessment: post-procedure vital signs reviewed and stable Respiratory status: spontaneous breathing, nonlabored ventilation, respiratory function stable and patient connected to nasal cannula oxygen Cardiovascular status: blood pressure returned to baseline and stable Postop Assessment: no apparent nausea or vomiting Anesthetic complications: no   No notable events documented.   Last Vitals:  Vitals:   03/26/21 1215 03/26/21 1230  BP: (!) 141/91 (!) 148/89  Pulse: 82 85  Resp: 14 18  Temp:  36.9 C  SpO2: 94% 96%    Last Pain:  Vitals:   03/26/21 1230  TempSrc: Oral  PainSc: Asleep                 Tonny Bollman

## 2021-03-26 NOTE — Progress Notes (Signed)
OBSTETRICS AND GYNECOLOGY PRE-OPERATIVE NOTE   Pre-Op Diagnosis: Left adnexal cyst (10-12 cm), abdominal pain, history of PCOS.  POD#5 s/p ERCP and robotic assisted laparoscopic cholecystectomy.  Planned Procedure: Left ovarian cystectomy, possible oophorectomy  Surgeons: Rubie Maid, MD  Anesthesia: General  Blood: Type and screened  Labs:  CBC Latest Ref Rng & Units 03/26/2021 03/25/2021 03/24/2021  WBC 4.0 - 10.5 K/uL 14.5(H) 12.4(H) 10.1  Hemoglobin 12.0 - 15.0 g/dL 12.2 11.5(L) 10.9(L)  Hematocrit 36.0 - 46.0 % 36.9 35.2(L) 34.1(L)  Platelets 150 - 400 K/uL 451(H) 332 307   CMP     Component Value Date/Time   NA 133 (L) 03/26/2021 0525   K 3.9 03/26/2021 0525   CL 95 (L) 03/26/2021 0525   CO2 29 03/26/2021 0525   GLUCOSE 132 (H) 03/26/2021 0525   BUN 9 03/26/2021 0525   CREATININE 0.58 03/26/2021 0525   CALCIUM 8.8 (L) 03/26/2021 0525   PROT 6.9 03/26/2021 0525   ALBUMIN 2.9 (L) 03/26/2021 0525   AST 14 (L) 03/26/2021 0525   ALT 25 03/26/2021 0525   ALKPHOS 107 03/26/2021 0525   BILITOT 0.4 03/26/2021 0525   GFRNONAA >60 03/26/2021 0525       Antibiotics: Ancef 2g preoperatively, previously on Zosyn inpatient.    Consent: Signed and on chart      Rubie Maid, MD [facility]

## 2021-03-26 NOTE — Progress Notes (Signed)
Patient ID: Beth Savage, female   DOB: [birth year], 46 y.o.   MRN: [id number]     [facility] Day(s): 5.   Interval History: Patient seen and examined, no acute events or new complaints overnight.  Patient evaluated after left salpingectomy and ovarian cyst aspiration.  Patient seen drowsy.  Not able to stay awake for conversation.  Daughter at bedside.  Vital signs in last 24 hours: [min-max] current  Temp:  [97.5 F (36.4 C)-99 F (37.2 C)] 98.5 F (36.9 C) (02/26 1230) Pulse Rate:  [82-110] 85 (02/26 1230) Resp:  [14-23] 18 (02/26 1230) BP: (130-193)/(76-108) 148/89 (02/26 1230) SpO2:  [92 %-100 %] 96 % (02/26 1230)     Height: 5\' 2"  (157.5 cm) Weight: 102.1 kg BMI (Calculated): 41.14   Physical Exam:  Constitutional: Sleepy Respiratory: breathing non-labored at rest  Cardiovascular: regular rate and sinus rhythm  Gastrointestinal: soft, drain in place.  Labs:  CBC Latest Ref Rng & Units 03/26/2021 03/25/2021 03/24/2021  WBC 4.0 - 10.5 K/uL 14.5(H) 12.4(H) 10.1  Hemoglobin 12.0 - 15.0 g/dL 12.2 11.5(L) 10.9(L)  Hematocrit 36.0 - 46.0 % 36.9 35.2(L) 34.1(L)  Platelets 150 - 400 K/uL 451(H) 332 307   CMP Latest Ref Rng & Units 03/26/2021 03/25/2021 03/24/2021  Glucose 70 - 99 mg/dL 132(H) 155(H) 159(H)  BUN 6 - 20 mg/dL 9 9 7   Creatinine 0.44 - 1.00 mg/dL 0.58 0.60 0.41(L)  Sodium 135 - 145 mmol/L 133(L) 131(L) 134(L)  Potassium 3.5 - 5.1 mmol/L 3.9 4.0 3.8  Chloride 98 - 111 mmol/L 95(L) 94(L) 96(L)  CO2 22 - 32 mmol/L 29 26 30   Calcium 8.9 - 10.3 mg/dL 8.8(L) 8.5(L) 8.1(L)  Total Protein 6.5 - 8.1 g/dL 6.9 7.0 6.7  Total Bilirubin 0.3 - 1.2 mg/dL 0.4 0.6 0.7  Alkaline Phos 38 - 126 U/L 107 107 89  AST 15 - 41 U/L 14(L) 15 14(L)  ALT 0 - 44 U/L 25 28 36    Imaging studies: No new pertinent imaging studies   Assessment/Plan:  46 y.o. female with choledocholithiasis and acute cholecystitis 5 Day Post-Op s/p ERCP and robotic assisted laparoscopic  cholecystectomy.   I can this morning today patient was in the OR for treatment of a large left ovarian cyst.  I came back at 1 PM and patient was at her room.  Patient still drowsy from anesthesia.  Not able to keep a conversation.  Daughter at bedside.  Unable to assess pain.  I will continue with current pain management with standing Toradol, muscle relaxer, pregabalin, and oxycodone and Dilaudid as needed.  I will change MiraLAX to twice per day until we have a bowel movement.  Agreed to continue to supplement.  I will continue to follow closely.  Arnold Long, MD

## 2021-03-26 NOTE — Progress Notes (Signed)
Patient is back from surgery, She is A and O X 4. Resting at this time. Vitals are with in normal limits, Family at bedside.

## 2021-03-26 NOTE — Progress Notes (Signed)
PROGRESS NOTE    Beth Savage  GYJ:[id number] DOB: 10-12-1975 DOA: 03/21/2021 PCP: Simona Huh, NP    Brief Narrative:  46 y.o. female with medical history significant of diabetes mellitus, hypothyroidism, PCOS, anxiety and depression presented to ED with abdominal pain, nausea and vomiting since Saturday.  Pain is progressively worsening, mostly in right upper quadrant, radiates to her back, increase by applying pressure and has not noticed any relieving factors. Pain was associated with nausea and vomiting, unable to keep anything down.  Denies any fever or chills.  Appetite decreased.  No diarrhea, no bowel movement for the past 2 days stating that there was nothing in my belly.  No urinary symptoms.  Clinical concern for choledocholithiasis.  Completed ERCP.  Biliary sludge noted.  Cystic duct stone noted biliary sphincterotomy performed.  Laparoscopic cholecystectomy performed same day.  Patient tolerated procedure reasonably well.  Has some postoperative pain is to be expected on postoperative day #1.  Patient having persistent pain and bloating she attributes to gas.  Had BM after enema on 2/24.  Felt better after BM however following morning increasing pain mainly on the left side.  Case discussed with general surgery.  Will order repeat CT abdomen to assess for possible fluid collection.  JP drain in place and continues to drain biliary fluid.  Drain output has been decreasing daily.  Concern for fluid collection in abdomen which CT will be able to detect.  2/26: Case discussed with general surgery.  Results of CT abdomen were somewhat confounding.  Surgical site in gallbladder fossa appears clear.  No residual fluid collection that explain patient's pain.  As patient's pain was mainly on the left side CT abdomen also revealed 2 large adnexal cysts 4.5 and 12 cm in diameter respectively.  Case discussed with Dr. Marcelline Mates from OB/GYN.  She evaluated bedside and found the patient to be an  appropriate surgical candidate for ovarian cystectomy and possible oophorectomy.  Patient evaluated at bedside after cystectomy.  Sleepy, unable to provide history.  Daughter at bedside  Assessment & Plan:   Principal Problem:   Cholecystitis, acute with cholelithiasis Active Problems:   Abnormal MRI, liver   Abdominal pain   Ovarian cyst   S/P cholecystectomy  Right upper quadrant pain Intractable nausea and vomiting Cystic duct stone Cholecystitis Patient with some significant postoperative pain This to be expected Discussed with general surgery Pain control is been a challenge Patient did have BM on 2/24, none since OB/GYN consulted 2/25 for evaluation of large ovarian cysts Status post operating room on 2/26 for cystectomy Plan: Continue multimodal pain control As needed antiemetics Twice daily MiraLAX per surgery Continue to follow closely  History of PCOS Left ovarian cysts Patient with left abdominal pain Could be related to ovarian cyst Status post left cystectomy 2/26  Type 2 diabetes mellitus Home regimen of metformin and Ozempic Currently on sliding scale  Hypothyroidism PTA Synthroid  Anxiety and depression PTA Xanax Patient was reluctant to take Xanax for fear that it would worsen her constipation.   Okay for low-dose in setting of acute anxiety and acute illness  Morbid obesity BMI 41.15 Counseled patient This complicates overall care and prognosis    DVT prophylaxis: SQ Lovenox Code Status: Full Family Communication: Daughter at bedside 2/26 Disposition Plan: Status is: Inpatient  Remains inpatient appropriate because: Intractable pain status post cholecystectomy.  Left ovarian cyst for or today for cystectomy  Level of care: Med-Surg  Consultants:  GI General surgery  Procedures:  ERCP 2/21 Laparoscopic cholecystectomy 2/21   Antimicrobials: Zosyn   Subjective: Seen and examined.  Sleepy this morning after operating  room  Objective: Vitals:   03/25/21 1000 03/25/21 1545 03/25/21 2008 03/26/21 0534  BP: (!) 178/101 (!) 193/108 (!) 169/97 (!) 154/90  Pulse: 93 86 (!) 110 92  Resp:  20 20 16   Temp:  98.6 F (37 C) (!) 97.5 F (36.4 C) 97.9 F (36.6 C)  TempSrc:  Oral Oral Oral  SpO2: 98% 95% 97% 99%  Weight:      Height:        Intake/Output Summary (Last 24 hours) at 03/26/2021 1059 Last data filed at 03/26/2021 0542 Gross per 24 hour  Intake 100 ml  Output 150 ml  Net -50 ml   Filed Weights   03/20/21 2027 03/21/21 1348  Weight: 99.8 kg 102.1 kg    Examination:  General exam: Sleepy Respiratory system: Clear to auscultation. Respiratory effort normal. Cardiovascular system: S1-S2, RRR, no murmurs, no pedal edema Gastrointestinal system: Obese, left-sided TTP, normal bowel sounds, JP drain in place Central nervous system: Unable to assess orientation.  No focal deficits Extremities: Symmetric 5 x 5 power. Skin: No rashes, lesions or ulcers Psychiatry: Judgement and insight appear normal. Mood & affect appropriate.     Data Reviewed: I have personally reviewed following labs and imaging studies  CBC: Recent Labs  Lab 03/20/21 2030 03/22/21 0516 03/23/21 0515 03/24/21 0514 03/25/21 0812 03/26/21 0525  WBC 11.9* 12.8* 13.3* 10.1 12.4* 14.5*  NEUTROABS 8.3*  --  9.8* 7.4 9.4* 10.1*  HGB 12.8 11.9* 11.7* 10.9* 11.5* 12.2  HCT 40.2 37.2 35.8* 34.1* 35.2* 36.9  MCV 84.6 84.4 84.4 84.8 84.4 82.7  PLT 411* 362 366 307 332 119*   Basic Metabolic Panel: Recent Labs  Lab 03/22/21 0516 03/23/21 0515 03/24/21 0514 03/25/21 0716 03/26/21 0525  NA 135 135 134* 131* 133*  K 4.3 3.9 3.8 4.0 3.9  CL 99 98 96* 94* 95*  CO2 26 27 30 26 29   GLUCOSE 119* 128* 159* 155* 132*  BUN 7 8 7 9 9   CREATININE 0.57 0.52 0.41* 0.60 0.58  CALCIUM 8.3* 8.1* 8.1* 8.5* 8.8*   GFR: Estimated Creatinine Clearance: 100.4 mL/min (by C-G formula based on SCr of 0.58 mg/dL). Liver Function  Tests: Recent Labs  Lab 03/22/21 0516 03/23/21 0515 03/24/21 0514 03/25/21 0716 03/26/21 0525  AST 66* 20 14* 15 14*  ALT 96* 56* 36 28 25  ALKPHOS 95 84 89 107 107  BILITOT 0.6 0.8 0.7 0.6 0.4  PROT 6.9 6.7 6.7 7.0 6.9  ALBUMIN 3.3* 3.2* 2.9* 3.1* 2.9*   Recent Labs  Lab 03/20/21 2030 03/22/21 0516  LIPASE 40 25   No results for input(s): AMMONIA in the last 168 hours. Coagulation Profile: Recent Labs  Lab 03/22/21 0516  INR 1.1   Cardiac Enzymes: No results for input(s): CKTOTAL, CKMB, CKMBINDEX, TROPONINI in the last 168 hours. BNP (last 3 results) No results for input(s): PROBNP in the last 8760 hours. HbA1C: No results for input(s): HGBA1C in the last 72 hours.  CBG: Recent Labs  Lab 03/24/21 2344 03/25/21 0747 03/25/21 1159 03/25/21 1710 03/25/21 2136  GLUCAP 137* 141* 108* 128* 242*   Lipid Profile: No results for input(s): CHOL, HDL, LDLCALC, TRIG, CHOLHDL, LDLDIRECT in the last 72 hours. Thyroid Function Tests: No results for input(s): TSH, T4TOTAL, FREET4, T3FREE, THYROIDAB in the last 72 hours.  Anemia Panel: No results for input(s): VITAMINB12, FOLATE,  FERRITIN, TIBC, IRON, RETICCTPCT in the last 72 hours. Sepsis Labs: No results for input(s): PROCALCITON, LATICACIDVEN in the last 168 hours.  Recent Results (from the past 240 hour(s))  Resp Panel by RT-PCR (Flu A&B, Covid) Nasopharyngeal Swab     Status: None   Collection Time: 03/20/21  8:30 PM   Specimen: Nasopharyngeal Swab; Nasopharyngeal(NP) swabs in vial transport medium  Result Value Ref Range Status   SARS Coronavirus 2 by RT PCR NEGATIVE NEGATIVE Final    Comment: (NOTE) SARS-CoV-2 target nucleic acids are NOT DETECTED.  The SARS-CoV-2 RNA is generally detectable in upper respiratory specimens during the acute phase of infection. The lowest concentration of SARS-CoV-2 viral copies this assay can detect is 138 copies/mL. A negative result does not preclude SARS-Cov-2 infection  and should not be used as the sole basis for treatment or other patient management decisions. A negative result may occur with  improper specimen collection/handling, submission of specimen other than nasopharyngeal swab, presence of viral mutation(s) within the areas targeted by this assay, and inadequate number of viral copies(<138 copies/mL). A negative result must be combined with clinical observations, patient history, and epidemiological information. The expected result is Negative.  Fact Sheet for Patients:  EntrepreneurPulse.com.au  Fact Sheet for Healthcare Providers:  IncredibleEmployment.be  This test is no t yet approved or cleared by the [place] FDA and  has been authorized for detection and/or diagnosis of SARS-CoV-2 by FDA under an Emergency Use Authorization (EUA). This EUA will remain  in effect (meaning this test can be used) for the duration of the COVID-19 declaration under Section 564(b)(1) of the Act, 21 U.S.C.section 360bbb-3(b)(1), unless the authorization is terminated  or revoked sooner.       Influenza A by PCR NEGATIVE NEGATIVE Final   Influenza B by PCR NEGATIVE NEGATIVE Final    Comment: (NOTE) The Xpert Xpress SARS-CoV-2/FLU/RSV plus assay is intended as an aid in the diagnosis of influenza from Nasopharyngeal swab specimens and should not be used as a sole basis for treatment. Nasal washings and aspirates are unacceptable for Xpert Xpress SARS-CoV-2/FLU/RSV testing.  Fact Sheet for Patients: EntrepreneurPulse.com.au  Fact Sheet for Healthcare Providers: IncredibleEmployment.be  This test is not yet approved or cleared by the [place] FDA and has been authorized for detection and/or diagnosis of SARS-CoV-2 by FDA under an Emergency Use Authorization (EUA). This EUA will remain in effect (meaning this test can be used) for the duration of the COVID-19 declaration  under Section 564(b)(1) of the Act, 21 U.S.C. section 360bbb-3(b)(1), unless the authorization is terminated or revoked.  Performed at [facility], [address] [place],  [address]          Radiology Studies: US PELVIS (TRANSABDOMINAL ONLY)  Result Date: 03/25/2021 CLINICAL DATA:  Left adnexal cyst seen on CT of 03/25/2021. EXAM: TRANSABDOMINAL ULTRASOUND OF PELVIS DOPPLER ULTRASOUND OF OVARIES TECHNIQUE: Transabdominal ultrasound examination of the pelvis was performed including evaluation of the uterus, ovaries, adnexal regions, and pelvic cul-de-sac. Color and duplex Doppler ultrasound was utilized to evaluate blood flow to the ovaries. COMPARISON:  CT dated 03/25/2021. FINDINGS: Uterus Measurements: 9.4 x 3.5 x 6.7 cm = volume: 118 mL. No fibroids or other mass visualized. Endometrium Thickness: 6 mm.  No focal abnormality visualized. Right ovary Measurements: 1.9 x 1.4 x 2.4 cm = volume: 3.4 mL. Normal appearance/no adnexal mass. Left ovary Measurements: 12.3 x 9.8 x 8.2 cm = volume: 515 mL. Two cysts in the left ovary measure up to  8.5 x 10.3 x 10.4 cm. Follow-up with ultrasound in 8-12 weeks recommended. Pulsed Doppler evaluation demonstrates normal low-resistance arterial and venous waveforms in both ovaries. Other: None IMPRESSION: Left ovarian cysts, otherwise unremarkable pelvic ultrasound. Follow-up with ultrasound in 8-12 weeks recommended. Electronically Signed   By: Anner Crete M.D.   On: 03/25/2021 21:39   CT ABDOMEN PELVIS W CONTRAST  Result Date: 03/25/2021 CLINICAL DATA:  Postop abdominal pain. Choledocholithiasis and acute cholecystitis. Four days postop from ERCP and robotic assisted laparoscopic cholecystectomy. Worsening pain. EXAM: CT ABDOMEN AND PELVIS WITH CONTRAST TECHNIQUE: Multidetector CT imaging of the abdomen and pelvis was performed using the standard protocol following bolus administration of intravenous contrast. RADIATION DOSE  REDUCTION: This exam was performed according to the departmental dose-optimization program which includes automated exposure control, adjustment of the mA and/or kV according to patient size and/or use of iterative reconstruction technique. CONTRAST:  1107mL OMNIPAQUE IOHEXOL 300 MG/ML  SOLN COMPARISON:  MRI of the abdomen on 03/21/2021 FINDINGS: Lower chest: There is bibasilar atelectasis, RIGHT greater than LEFT. Heart size is normal. Hepatobiliary: There is focal fatty infiltration adjacent to the falciform ligament. Liver is homogeneous. Previous cholecystectomy. The gallbladder fossa contains a trace amount of fluid but no air. Small amount of fluid tracks from the gallbladder fossa and is seen adjacent to the descending portion of the duodenum. No associated gas or evidence for abscess in this region. Common bile duct is normal in appearance. No radiopaque calculi identified within the biliary tree. No biliary duct dilatation. Pancreas: Unremarkable. No pancreatic ductal dilatation or surrounding inflammatory changes. Spleen: Normal in size without focal abnormality. Adrenals/Urinary Tract: Adrenal glands are normal. Kidneys and ureters are unremarkable. The bladder and visualized portion of the urethra are normal. Stomach/Bowel: Stomach is unremarkable. Small amount of fluid around the descending duodenum. Otherwise, small bowel loops are normal in appearance. Appendix is normal. Large bowel is decompressed with rare colonic diverticula. No acute inflammatory changes. RIGHT LOWER QUADRANT surgical drain is in place, not associated with fluid collection. Vascular/Lymphatic: No significant vascular findings are present. No enlarged abdominal or pelvic lymph nodes. Reproductive: The uterus is present. RIGHT ovary has a normal CT appearance. There are 2 low-attenuation structures in the LEFT adnexal regions measuring 4.5 x 3.8 centimeters and 12.0 x 8.2 x 11.0 Centimeters. Further characterization of these adnexal  cyst is needed. Other: Abdominal wall is unremarkable. No ascites. No free intraperitoneal air. Musculoskeletal: No acute or significant osseous findings. IMPRESSION: 1. Large LEFT adnexal cystic masses measuring 4.5 and 12.0 centimeters. Further characterization with transabdominal and endovaginal pelvic ultrasound recommended. 2. Interval cholecystectomy. Trace amount of fluid in the gallbladder fossa and surrounding the descending portion of the duodenum are likely postoperative. No associated air or evidence for abscess in these regions. No radiopaque calculi or evidence for biliary tract dilatation. 3. Unremarkable course of the RIGHT LOWER QUADRANT drain, without surrounding fluid. 4. Hepatic steatosis. 5. Minimal colonic diverticulosis without acute diverticulitis. 6. RIGHT ovary has a normal CT appearance. Electronically Signed   By: Nolon Nations M.D.   On: 03/25/2021 14:33   US PELVIC DOPPLER (TORSION R/O OR MASS ARTERIAL FLOW)  Result Date: 03/25/2021 CLINICAL DATA:  Left adnexal cyst seen on CT of 03/25/2021. EXAM: TRANSABDOMINAL ULTRASOUND OF PELVIS DOPPLER ULTRASOUND OF OVARIES TECHNIQUE: Transabdominal ultrasound examination of the pelvis was performed including evaluation of the uterus, ovaries, adnexal regions, and pelvic cul-de-sac. Color and duplex Doppler ultrasound was utilized to evaluate blood flow to the ovaries. COMPARISON:  CT dated 03/25/2021. FINDINGS: Uterus Measurements: 9.4 x 3.5 x 6.7 cm = volume: 118 mL. No fibroids or other mass visualized. Endometrium Thickness: 6 mm.  No focal abnormality visualized. Right ovary Measurements: 1.9 x 1.4 x 2.4 cm = volume: 3.4 mL. Normal appearance/no adnexal mass. Left ovary Measurements: 12.3 x 9.8 x 8.2 cm = volume: 515 mL. Two cysts in the left ovary measure up to 8.5 x 10.3 x 10.4 cm. Follow-up with ultrasound in 8-12 weeks recommended. Pulsed Doppler evaluation demonstrates normal low-resistance arterial and venous waveforms in both  ovaries. Other: None IMPRESSION: Left ovarian cysts, otherwise unremarkable pelvic ultrasound. Follow-up with ultrasound in 8-12 weeks recommended. Electronically Signed   By: Anner Crete M.D.   On: 03/25/2021 21:39        Scheduled Meds:  [MAR Hold] amLODipine  5 mg Oral Daily   [MAR Hold] cyclobenzaprine  10 mg Oral TID   [MAR Hold] enoxaparin (LOVENOX) injection  0.5 mg/kg Subcutaneous Q24H   [MAR Hold] insulin aspart  0-15 Units Subcutaneous TID WC   [MAR Hold] insulin aspart  0-5 Units Subcutaneous QHS   [MAR Hold] ketorolac  30 mg Intravenous Q6H   [MAR Hold] levothyroxine  250 mcg Oral Q0600   [MAR Hold] pregabalin  100 mg Oral TID   [MAR Hold] senna-docusate  1 tablet Oral BID   [MAR Hold] simethicone  80 mg Oral QID   Continuous Infusions:  lactated ringers 100 mL/hr at 03/26/21 0851   piperacillin-tazobactam (ZOSYN)  IV 3.375 g (03/26/21 0538)     LOS: 5 days        Sidney Ace, MD [facility]   If 7PM-7AM, please contact night-coverage  03/26/2021, 10:59 AM

## 2021-03-26 NOTE — Anesthesia Procedure Notes (Signed)
Procedure Name: Intubation Date/Time: 03/26/2021 9:06 AM Performed by: Aline Brochure, CRNA Pre-anesthesia Checklist: Patient identified, Patient being monitored, Timeout performed, Emergency Drugs available and Suction available Patient Re-evaluated:Patient Re-evaluated prior to induction Oxygen Delivery Method: Circle system utilized Preoxygenation: Pre-oxygenation with 100% oxygen Induction Type: IV induction Ventilation: Mask ventilation without difficulty Laryngoscope Size: 3 and McGraph Grade View: Grade I Tube type: Oral Tube size: 7.0 mm Number of attempts: 1 Airway Equipment and Method: Stylet and Video-laryngoscopy Placement Confirmation: ETT inserted through vocal cords under direct vision, positive ETCO2 and breath sounds checked- equal and bilateral Secured at: 21 cm Tube secured with: Tape Dental Injury: Teeth and Oropharynx as per pre-operative assessment

## 2021-03-26 NOTE — Anesthesia Preprocedure Evaluation (Addendum)
Anesthesia Evaluation  Patient identified by MRN, date of birth, ID band Patient awake    Reviewed: Allergy & Precautions, H&P , NPO status , Patient's Chart, lab work & pertinent test results, reviewed documented beta blocker date and time   History of Anesthesia Complications Negative for: history of anesthetic complications  Airway Mallampati: II  TM Distance: >3 FB Neck ROM: full    Dental  (+) Dental Advidsory Given, Caps, Chipped   Pulmonary neg shortness of breath, neg sleep apnea, neg COPD, neg recent URI, Current Smoker and Patient abstained from smoking.,    Pulmonary exam normal breath sounds clear to auscultation       Cardiovascular Exercise Tolerance: Good negative cardio ROS Normal cardiovascular exam Rhythm:regular Rate:Normal     Neuro/Psych PSYCHIATRIC DISORDERS Anxiety Depression negative neurological ROS     GI/Hepatic negative GI ROS, (+) neg Cirrhosis      , NAFLD   Endo/Other  diabetes, Well Controlled, Type 2Hypothyroidism Morbid obesity  Renal/GU negative Renal ROS  negative genitourinary   Musculoskeletal   Abdominal   Peds  Hematology negative hematology ROS (+)   Anesthesia Other Findings Past Medical History: No date: Anxiety and depression No date: Diabetes mellitus without complication (HCC) No date: Gallstone No date: PCOS (polycystic ovarian syndrome)   Reproductive/Obstetrics negative OB ROS                             Anesthesia Physical  Anesthesia Plan  ASA: 3 and emergent  Anesthesia Plan: General   Post-op Pain Management:    Induction: Intravenous  PONV Risk Score and Plan: 2 and Ondansetron, Dexamethasone, Midazolam, Treatment may vary due to age or medical condition and Promethazine  Airway Management Planned: Oral ETT  Additional Equipment:   Intra-op Plan:   Post-operative Plan: Extubation in OR  Informed Consent: I have  reviewed the patients History and Physical, chart, labs and discussed the procedure including the risks, benefits and alternatives for the proposed anesthesia with the patient or authorized representative who has indicated his/her understanding and acceptance.     Dental Advisory Given  Plan Discussed with: Anesthesiologist, CRNA and Surgeon  Anesthesia Plan Comments: (  Patient consented for risks of anesthesia including but not limited to:  - adverse reactions to medications - damage to eyes, teeth, lips or other oral mucosa - nerve damage due to positioning  - sore throat or hoarseness - damage to heart, brain, nerves, lungs, other parts of body or loss of life  Informed patient about role of CRNA in peri- and intra-operative care.  Patient voiced understanding.)        Anesthesia Quick Evaluation

## 2021-03-26 NOTE — Op Note (Signed)
Procedure(s): LAPAROSCOPIC LEFT SALPINGECTOMY WITH OVARIAN CYST ASPIRATION Procedure Note  Beth Savage female 46 y.o. 03/26/2021  Indications: The patient is a 46 y.o. G30P1011 female with left adnexal mass x 2 (10 cm and 4.5 cm). History of PCOS, hypothyroidism, DM.  Significantly, also POD#5 s/p ERCP and robotic assisted laparoscopic cholecystectomy. Patient was noted to have persistent post-operative pain for days after initial procedure, prompting further investigation and discovery of large adnexal mass on CT scan. Has JP drain in place in right laparoscopic incision site.  Pre-operative Diagnosis: Left adnexal masses, history of PCOS, DM, hypothyroidism, recent abdominal surgery (ERCP and robotic laparoscopic cholecystectomy), JP drain in place  Post-operative Diagnosis: Same  with largest mass fallopian in nature, smaller is ovarian, small submucosal uterine fibroid (fundal). Adhesions of adnexal cyst to mesentery of small bowel.   Surgeon: Rubie Maid, MD  Assistants:  Surgical Scrub tech  Anesthesia: General endotracheal anesthesia  Procedure Details: The patient was seen in the Holding Room. The risks, benefits, complications, treatment options, and expected outcomes were discussed with the patient.  The patient concurred with the proposed plan, giving informed consent.  The site of surgery properly noted/marked. The patient was taken to the Operating Room, identified as Beth Savage and the procedure verified as Procedure(s) (LRB): LAPAROSCOPIC LEFT OVARIAN CYSTECTOMY, POSSIBLE OOPHORECTOMY.   She was then placed under general anesthesia without difficulty. She was placed in the dorsal lithotomy position, and was prepped and draped in a sterile manner.  A straight catheterization was performed. A sterile speculum was inserted into the vagina and the cervix was grasped at the anterior lip using a single-toothed tenaculum.  The uterus was sounded to 9 cm, and a Hulka  clamp was placed for uterine manipulation.  The speculum and tenaculum were then removed. After an adequate timeout was performed, attention was turned to the abdomen where an umbilical incision was made with the scalpel.  The Optiview 11-mm trocar and sleeve were then advanced without difficulty with the laparoscope under direct visualization into the abdomen.  The abdomen was then insufflated with carbon dioxide gas and adequate pneumoperitoneum was obtained. A 5-mm left lower quadrant port and an 5-mm right lower quadrant port were then placed under direct visualization.  A survey of the patient's pelvis and abdomen revealed the findings as above.  Exploration of the cyst noted that it originated from the fallopian tube, and not the ovary. The ovary was noted to have a smaller follicular cyst present. A needle aspirator was inserted, and the fallopian tube cyst was allowed to drain with collection of ~ 480 ml of yellow-brown serous fluid. The dense adhesions of the fallopian tube cyst to mesentery of the small bowel were lysed using the Harmonic scalpel. This allowed for better mobilization of the cyst.  The second ovarian cyst was then incised and allowed to drain with ~ 10 ml of fluid present. The mesosalpinx of the left fallopian tube was incised to separate the ovary from the fallopian tube using the the Harmonic device until it was completely detached. The 11-mm port site was then extended to accommodate a 15 mm trochar and sleeve.  A 15-mm Endocatch bag was then inserted into the 15 mm trochar and the left tube and cyst were collected and brought to the incision.  There was still noted to be a significant amount of fluid in the cyst, so the cyst aspirator was then inserted again into the cyst for additional drainage. An additional 170 ml of yellow-brown  fluid was aspirated, and the cyst was able to delivered through the incision.  A cone was placed at the incision site, and PMI device was used to close the  15 mm port site under direct visualization.  Palpation of the incision site after closure noted no defect present.    The pneumoperitoneum was then re-established, and the laparoscope was then introduced once again into the abdominal cavity.  A final survey was performed, where good hemostasis was noted on the left side.  All trocars were removed under direct visualization, and the abdomen which was desufflated.    All skin incisions were closed with 4-0 Monocryl subcuticular stitches. Dermabond was placed over the incision. The patient tolerated the procedures well.  All instruments, needles, and sponge counts were correct x 2. The patient was taken to the recovery room awake, extubated and in stable condition.   Findings: The uterus was sounded to 9 cm. Previous laparoscopic port sites healing well, JP drain located at right upper abdomen, in place.  Visualized laparoscopically as well, appeared normal. Large left fallopian tube cyst observed. Small left ovarian cyst (follicular).  A total of 550 ml of yellow-brown fluid aspirated.  Small submucosal fibroid at fundus of uterus.  Normal appearing right fallopian tube and ovary.  Thick adhesions of fallopian tube cyst to mesentery of the small bowel.  Normal appearing upper abdomen.   Estimated Blood Loss:  minimal, < 5 ml      Drains: straight catheterization prior to procedure with  50 ml of clear urine. 20 ml of serous fluid in JP drain prior to start of procedure.          Total IV Fluids:  1000 ml  Specimens: Left fallopian tube and cyst         Implants: None         Complications:  None; patient tolerated the procedure well.         Disposition: PACU - hemodynamically stable.         Condition: stable   Rubie Maid, MD [facility]

## 2021-03-26 NOTE — Transfer of Care (Signed)
Immediate Anesthesia Transfer of Care Note  Patient: Totiana Everson Bowne  Procedure(s) Performed: LAPAROSCOPIC UNILATERAL SALPINGECTOMY WITH FLUID ASPIRATION (Left)  Patient Location: PACU  Anesthesia Type:General  Level of Consciousness: drowsy  Airway & Oxygen Therapy: Patient Spontanous Breathing and Patient connected to face mask oxygen  Post-op Assessment: Report given to RN and Post -op Vital signs reviewed and stable  Post vital signs: Reviewed and stable  Last Vitals:  Vitals Value Taken Time  BP 131/76   Temp    Pulse 92   Resp 23 03/26/21 1113  SpO2 98   Vitals shown include unvalidated device data.  Last Pain:  Vitals:   03/26/21 0534  TempSrc: Oral  PainSc:       Patients Stated Pain Goal: 3 (53/31/74 0992)  Complications: No notable events documented.

## 2021-03-27 ENCOUNTER — Encounter: Payer: Self-pay | Admitting: Obstetrics and Gynecology

## 2021-03-27 DIAGNOSIS — N83202 Unspecified ovarian cyst, left side: Secondary | ICD-10-CM

## 2021-03-27 DIAGNOSIS — K8000 Calculus of gallbladder with acute cholecystitis without obstruction: Secondary | ICD-10-CM | POA: Diagnosis not present

## 2021-03-27 LAB — GLUCOSE, CAPILLARY
Glucose-Capillary: 90 mg/dL (ref 70–99)
Glucose-Capillary: 173 mg/dL — ABNORMAL HIGH (ref 70–99)

## 2021-03-27 LAB — CBC WITH DIFFERENTIAL/PLATELET
Abs Immature Granulocytes: 0.06 10*3/uL (ref 0.00–0.07)
Basophils Absolute: 0 10*3/uL (ref 0.0–0.1)
Basophils Relative: 0 %
Eosinophils Absolute: 0.2 10*3/uL (ref 0.0–0.5)
Eosinophils Relative: 1 %
HCT: 33.9 % — ABNORMAL LOW (ref 36.0–46.0)
Hemoglobin: 11 g/dL — ABNORMAL LOW (ref 12.0–15.0)
Immature Granulocytes: 1 %
Lymphocytes Relative: 19 %
Lymphs Abs: 2.3 10*3/uL (ref 0.7–4.0)
MCH: 27.4 pg (ref 26.0–34.0)
MCHC: 32.4 g/dL (ref 30.0–36.0)
MCV: 84.5 fL (ref 80.0–100.0)
Monocytes Absolute: 0.9 10*3/uL (ref 0.1–1.0)
Monocytes Relative: 7 %
Neutro Abs: 8.6 10*3/uL — ABNORMAL HIGH (ref 1.7–7.7)
Neutrophils Relative %: 72 %
Platelets: 385 10*3/uL (ref 150–400)
RBC: 4.01 MIL/uL (ref 3.87–5.11)
RDW: 14.3 % (ref 11.5–15.5)
WBC: 12 10*3/uL — ABNORMAL HIGH (ref 4.0–10.5)
nRBC: 0 % (ref 0.0–0.2)

## 2021-03-27 LAB — COMPREHENSIVE METABOLIC PANEL
ALT: 22 U/L (ref 0–44)
AST: 13 U/L — ABNORMAL LOW (ref 15–41)
Albumin: 2.6 g/dL — ABNORMAL LOW (ref 3.5–5.0)
Alkaline Phosphatase: 94 U/L (ref 38–126)
Anion gap: 12 (ref 5–15)
BUN: 14 mg/dL (ref 6–20)
CO2: 29 mmol/L (ref 22–32)
Calcium: 8.4 mg/dL — ABNORMAL LOW (ref 8.9–10.3)
Chloride: 98 mmol/L (ref 98–111)
Creatinine, Ser: 0.58 mg/dL (ref 0.44–1.00)
GFR, Estimated: >60 mL/min (ref >60)
Glucose, Bld: 95 mg/dL (ref 70–99)
Potassium: 4.2 mmol/L (ref 3.5–5.1)
Sodium: 139 mmol/L (ref 135–145)
Total Bilirubin: 0.3 mg/dL (ref 0.3–1.2)
Total Protein: 5.8 g/dL — ABNORMAL LOW (ref 6.5–8.1)

## 2021-03-27 MED ORDER — SENNOSIDES-DOCUSATE SODIUM 8.6-50 MG PO TABS: 1.0000 | ORAL_TABLET | Freq: Two times a day (BID) | ORAL | 0 refills | Status: AC

## 2021-03-27 MED ORDER — OXYCODONE HCL 5 MG PO TABS: 5.0000 mg | ORAL_TABLET | ORAL | 0 refills | Status: DC | PRN

## 2021-03-27 MED ORDER — PREGABALIN 100 MG PO CAPS: 100.0000 mg | ORAL_CAPSULE | Freq: Three times a day (TID) | ORAL | 0 refills | Status: DC

## 2021-03-27 MED ORDER — METHOCARBAMOL 500 MG PO TABS: 500.0000 mg | ORAL_TABLET | Freq: Three times a day (TID) | ORAL | 0 refills | Status: AC

## 2021-03-27 MED ORDER — SIMETHICONE 80 MG PO CHEW: 80.0000 mg | CHEWABLE_TABLET | Freq: Four times a day (QID) | ORAL | 0 refills | Status: AC

## 2021-03-27 MED ORDER — NABUMETONE 500 MG PO TABS: 500.0000 mg | ORAL_TABLET | Freq: Two times a day (BID) | ORAL | 0 refills | Status: AC

## 2021-03-27 NOTE — Discharge Summary (Signed)
Physician Discharge Summary  Beth Savage IEP:[id number] DOB: [birth year] DOA: 03/21/2021  PCP: Simona Huh, NP  Admit date: 03/21/2021 Discharge date: 03/27/2021  Admitted From: Home Disposition: Home  Recommendations for Outpatient Follow-up:  Follow up with PCP in 1-2 weeks Follow-up outpatient general surgery Follow-up outpatient OB/GYN  Home Health: No Equipment/Devices: None  Discharge Condition: Stable CODE STATUS: Full Diet recommendation: Soft/bland  Brief/Interim Summary: 46 y.o. female with medical history significant of diabetes mellitus, hypothyroidism, PCOS, anxiety and depression presented to ED with abdominal pain, nausea and vomiting since Saturday.  Pain is progressively worsening, mostly in right upper quadrant, radiates to her back, increase by applying pressure and has not noticed any relieving factors. Pain was associated with nausea and vomiting, unable to keep anything down.  Denies any fever or chills.  Appetite decreased.  No diarrhea, no bowel movement for the past 2 days stating that there was nothing in my belly.  No urinary symptoms.   Clinical concern for choledocholithiasis.  Completed ERCP.  Biliary sludge noted.  Cystic duct stone noted biliary sphincterotomy performed.  Laparoscopic cholecystectomy performed same day.  Patient tolerated procedure reasonably well.  Has some postoperative pain is to be expected on postoperative day #1.   Patient having persistent pain and bloating she attributes to gas.  Had BM after enema on 2/24.  Felt better after BM however following morning increasing pain mainly on the left side.  Case discussed with general surgery.  Will order repeat CT abdomen to assess for possible fluid collection.  JP drain in place and continues to drain biliary fluid.  Drain output has been decreasing daily.  Concern for fluid collection in abdomen which CT will be able to detect.   2/26: Case discussed with general surgery.  Results of  CT abdomen were somewhat confounding.  Surgical site in gallbladder fossa appears clear.  No residual fluid collection that explain patient's pain.  As patient's pain was mainly on the left side CT abdomen also revealed 2 large adnexal cysts 4.5 and 12 cm in diameter respectively.  Case discussed with Dr. Marcelline Mates from OB/GYN.  She evaluated bedside and found the patient to be an appropriate surgical candidate for ovarian cystectomy and possible oophorectomy.   Patient evaluated at bedside after cystectomy.  Sleepy, unable to provide history.  Daughter at bedside  2/27: Patient evaluated this morning.  Much improved.  Pain resolved.  Appreciate intervention from general surgery and OB/GYN.  Patient discharged home on multimodal pain regimen.  We will follow-up outpatient PCP, general surgery, OB/GYN.    Discharge Diagnoses:  Principal Problem:   Cholecystitis, acute with cholelithiasis Active Problems:   Abnormal MRI, liver   Abdominal pain   Ovarian cyst   S/P cholecystectomy   Pain  Right upper quadrant pain Intractable nausea and vomiting Cystic duct stone Cholecystitis Patient with some significant postoperative pain This to be expected Discussed with general surgery Pain control is been a challenge Patient did have BM on 2/24, none since OB/GYN consulted 2/25 for evaluation of large ovarian cysts Status post operating room on 2/26 for cystectomy Plan: Tolerated cystoscopy well.  Stable for discharge home.  Drain care provided by general surgery at bedside.  Will discharge on multimodal pain regimen.  Follow-up outpatient PCP, general surgery, OB/GYN   History of PCOS Left ovarian cysts Patient with left abdominal pain Could be related to ovarian cyst Status post left cystectomy 2/26 Tolerated well.  Discharged home with outpatient follow-up   Type 2 diabetes mellitus  Home regimen of metformin and Ozempic Can resume home regimen on discharge   Hypothyroidism PTA  Synthroid   Anxiety and depression PTA Xanax    Morbid obesity BMI 41.15 Counseled patient This complicates overall care and prognosis  Discharge Instructions  Discharge Instructions     Diet - low sodium heart healthy   Complete by: As directed    Increase activity slowly   Complete by: As directed    No wound care   Complete by: As directed       Allergies as of 03/27/2021   No Known Allergies      Medication List     TAKE these medications    ALPRAZolam 1 MG tablet Commonly known as: XANAX Take 1 mg by mouth 3 (three) times daily as needed. What changed: Another medication with the same name was removed. Continue taking this medication, and follow the directions you see here.   Contrave 8-90 MG Tb12 Generic drug: Naltrexone-buPROPion HCl ER Take by mouth.   IRON PO Take by mouth once a week.   ISIBLOOM PO Take by mouth.   Isibloom 0.15-30 MG-MCG tablet Generic drug: desogestrel-ethinyl estradiol Take 1 tablet by mouth daily.   levothyroxine 50 MCG tablet Commonly known as: SYNTHROID Take 50 mcg by mouth daily.   levothyroxine 200 MCG tablet Commonly known as: SYNTHROID Take 200 mcg by mouth daily.   MAGNESIUM PO Take by mouth.   metFORMIN 500 MG tablet Commonly known as: GLUCOPHAGE Take 500 mg by mouth daily.   methocarbamol 500 MG tablet Commonly known as: ROBAXIN Take 1 tablet (500 mg total) by mouth 3 (three) times daily for 14 days.   nabumetone 500 MG tablet Commonly known as: RELAFEN Take 1 tablet (500 mg total) by mouth 2 (two) times daily for 15 days.   oxyCODONE 5 MG immediate release tablet Commonly known as: Oxy IR/ROXICODONE Take 1 tablet (5 mg total) by mouth every 4 (four) hours as needed for moderate pain.   Ozempic (1 MG/DOSE) 4 MG/3ML Sopn Generic drug: Semaglutide (1 MG/DOSE) Inject 1 mg as directed once a week.   phentermine 37.5 MG tablet Commonly known as: ADIPEX-P Take 56.25 mg by mouth daily.    phentermine 37.5 MG capsule Take 37.5 mg by mouth daily.   pregabalin 100 MG capsule Commonly known as: LYRICA Take 1 capsule (100 mg total) by mouth 3 (three) times daily for 14 days.   senna-docusate 8.6-50 MG tablet Commonly known as: Senokot-S Take 1 tablet by mouth 2 (two) times daily.   simethicone 80 MG chewable tablet Commonly known as: MYLICON Chew 1 tablet (80 mg total) by mouth 4 (four) times daily.   Trintellix 10 MG Tabs tablet Generic drug: vortioxetine HBr Take 10 mg by mouth daily.   Vitamin D (Ergocalciferol) 1.25 MG (50000 UNIT) Caps capsule Commonly known as: DRISDOL Take 50,000 Units by mouth once a week.        Follow-up Information     Rubie Maid, MD Follow up.   Specialties: Obstetrics and Gynecology, Radiology Why: 10-14 days for post-op check Contact information: [place] [address] [phone]         Herbert Pun, MD Follow up on 03/30/2021.   Specialty: General Surgery Why: For follow up after cholecystectomy and drain check Contact information: [place] [address] [phone]         Simona Huh, NP. Schedule an appointment as soon as possible for a visit  in 1 week(s).   Specialty: Nurse Practitioner Contact information: [place] [address] [phone]                No Known Allergies  Consultations: GI General surgery  gynecology    Procedures/Studies: US PELVIS (TRANSABDOMINAL ONLY)  Result Date: 03/25/2021 CLINICAL DATA:  Left adnexal cyst seen on CT of 03/25/2021. EXAM: TRANSABDOMINAL ULTRASOUND OF PELVIS DOPPLER ULTRASOUND OF OVARIES TECHNIQUE: Transabdominal ultrasound examination of the pelvis was performed including evaluation of the uterus, ovaries, adnexal regions, and pelvic cul-de-sac. Color and duplex Doppler ultrasound was utilized to evaluate blood flow to the ovaries. COMPARISON:  CT dated 03/25/2021.  FINDINGS: Uterus Measurements: 9.4 x 3.5 x 6.7 cm = volume: 118 mL. No fibroids or other mass visualized. Endometrium Thickness: 6 mm.  No focal abnormality visualized. Right ovary Measurements: 1.9 x 1.4 x 2.4 cm = volume: 3.4 mL. Normal appearance/no adnexal mass. Left ovary Measurements: 12.3 x 9.8 x 8.2 cm = volume: 515 mL. Two cysts in the left ovary measure up to 8.5 x 10.3 x 10.4 cm. Follow-up with ultrasound in 8-12 weeks recommended. Pulsed Doppler evaluation demonstrates normal low-resistance arterial and venous waveforms in both ovaries. Other: None IMPRESSION: Left ovarian cysts, otherwise unremarkable pelvic ultrasound. Follow-up with ultrasound in 8-12 weeks recommended. Electronically Signed   By: Anner Crete M.D.   On: 03/25/2021 21:39   CT ABDOMEN PELVIS W CONTRAST  Result Date: 03/25/2021 CLINICAL DATA:  Postop abdominal pain. Choledocholithiasis and acute cholecystitis. Four days postop from ERCP and robotic assisted laparoscopic cholecystectomy. Worsening pain. EXAM: CT ABDOMEN AND PELVIS WITH CONTRAST TECHNIQUE: Multidetector CT imaging of the abdomen and pelvis was performed using the standard protocol following bolus administration of intravenous contrast. RADIATION DOSE REDUCTION: This exam was performed according to the departmental dose-optimization program which includes automated exposure control, adjustment of the mA and/or kV according to patient size and/or use of iterative reconstruction technique. CONTRAST:  182mL OMNIPAQUE IOHEXOL 300 MG/ML  SOLN COMPARISON:  MRI of the abdomen on 03/21/2021 FINDINGS: Lower chest: There is bibasilar atelectasis, RIGHT greater than LEFT. Heart size is normal. Hepatobiliary: There is focal fatty infiltration adjacent to the falciform ligament. Liver is homogeneous. Previous cholecystectomy. The gallbladder fossa contains a trace amount of fluid but no air. Small amount of fluid tracks from the gallbladder fossa and is seen adjacent to the  descending portion of the duodenum. No associated gas or evidence for abscess in this region. Common bile duct is normal in appearance. No radiopaque calculi identified within the biliary tree. No biliary duct dilatation. Pancreas: Unremarkable. No pancreatic ductal dilatation or surrounding inflammatory changes. Spleen: Normal in size without focal abnormality. Adrenals/Urinary Tract: Adrenal glands are normal. Kidneys and ureters are unremarkable. The bladder and visualized portion of the urethra are normal. Stomach/Bowel: Stomach is unremarkable. Small amount of fluid around the descending duodenum. Otherwise, small bowel loops are normal in appearance. Appendix is normal. Large bowel is decompressed with rare colonic diverticula. No acute inflammatory changes. RIGHT LOWER QUADRANT surgical drain is in place, not associated with fluid collection. Vascular/Lymphatic: No significant vascular findings are present. No enlarged abdominal or pelvic lymph nodes. Reproductive: The uterus is present. RIGHT ovary has a normal CT appearance. There are 2 low-attenuation structures in the LEFT adnexal regions measuring 4.5 x 3.8 centimeters and 12.0 x 8.2 x 11.0 Centimeters. Further characterization of these adnexal cyst is needed. Other: Abdominal wall is unremarkable. No ascites. No free intraperitoneal air. Musculoskeletal: No acute or  significant osseous findings. IMPRESSION: 1. Large LEFT adnexal cystic masses measuring 4.5 and 12.0 centimeters. Further characterization with transabdominal and endovaginal pelvic ultrasound recommended. 2. Interval cholecystectomy. Trace amount of fluid in the gallbladder fossa and surrounding the descending portion of the duodenum are likely postoperative. No associated air or evidence for abscess in these regions. No radiopaque calculi or evidence for biliary tract dilatation. 3. Unremarkable course of the RIGHT LOWER QUADRANT drain, without surrounding fluid. 4. Hepatic steatosis. 5.  Minimal colonic diverticulosis without acute diverticulitis. 6. RIGHT ovary has a normal CT appearance. Electronically Signed   By: Nolon Nations M.D.   On: 03/25/2021 14:33   MR 3D Recon At Scanner  Result Date: 03/21/2021 CLINICAL DATA:  46 year old female with history of right upper quadrant abdominal pain for the past 2 days. EXAM: MRI ABDOMEN WITHOUT AND WITH CONTRAST (INCLUDING MRCP) TECHNIQUE: Multiplanar multisequence MR imaging of the abdomen was performed both before and after the administration of intravenous contrast. Heavily T2-weighted images of the biliary and pancreatic ducts were obtained, and three-dimensional MRCP images were rendered by post processing. CONTRAST:  37mL GADAVIST GADOBUTROL 1 MMOL/ML IV SOLN COMPARISON:  Abdominal MRI 05/26/2015. FINDINGS: Lower chest: Unremarkable. Hepatobiliary: Mild diffuse loss of signal intensity throughout the hepatic parenchyma on out of phase dual echo images, indicative of a background of mild hepatic steatosis (improved compared to the prior study). No suspicious cystic or solid hepatic lesions. Very mild intra and extrahepatic biliary ductal dilatation is noted on MRCP images. Common bile duct measures up to 7 mm in the porta hepatis. There is abrupt termination of the distal common bile duct immediately above the level of the ampulla, best appreciated on coronal image 16 of series 3, where there is a strong suggestion of a distal ductal stone, which is difficult to corroborate on additional pulse sequences, but suspected on several, including MRCP image 3 of series 16. There are multiple small filling defects lying dependently within the gallbladder, indicative of gallstones. Gallbladder is only moderately distended. Gallbladder wall does not appear thickened. There is a trace volume of pericholecystic fluid, however. Pancreas: No pancreatic mass. No pancreatic ductal dilatation. No pancreatic or peripancreatic fluid collections or inflammatory  changes. Spleen:  Unremarkable. Adrenals/Urinary Tract: Subcentimeter T1 hypointense, T2 hyperintense, nonenhancing lesion in the medial aspect of the upper pole of the right kidney, compatible with a small simple cyst. Left kidney and bilateral adrenal glands are normal in appearance. No hydroureteronephrosis in the visualized portions of the abdomen. Stomach/Bowel: Visualized portions are unremarkable. Vascular/Lymphatic: No aneurysm identified in the visualized abdominal vasculature. No lymphadenopathy noted in the abdomen. Other: No significant volume of ascites noted in the visualized portions of the peritoneal cavity. Musculoskeletal: No aggressive appearing osseous lesions are noted in the visualized portions of the skeleton. IMPRESSION: 1. Study is positive for cholelithiasis. Gallbladder is only moderately distended, however, there is pericholecystic fluid and soft tissue stranding, suggesting acute inflammation. 2. Very mild intra and extrahepatic biliary ductal dilatation with strong suggestion of choledocholithiasis in the very distal aspect of the common bile duct immediately before the ampulla. If there is clinical evidence of biliary tract obstruction, correlation with ERCP should be considered. 3. Mild hepatic steatosis. Electronically Signed   By: Vinnie Langton M.D.   On: 03/21/2021 05:53   US PELVIC DOPPLER (TORSION R/O OR MASS ARTERIAL FLOW)  Result Date: 03/25/2021 CLINICAL DATA:  Left adnexal cyst seen on CT of 03/25/2021. EXAM: TRANSABDOMINAL ULTRASOUND OF PELVIS DOPPLER ULTRASOUND OF OVARIES TECHNIQUE: Transabdominal ultrasound  examination of the pelvis was performed including evaluation of the uterus, ovaries, adnexal regions, and pelvic cul-de-sac. Color and duplex Doppler ultrasound was utilized to evaluate blood flow to the ovaries. COMPARISON:  CT dated 03/25/2021. FINDINGS: Uterus Measurements: 9.4 x 3.5 x 6.7 cm = volume: 118 mL. No fibroids or other mass visualized. Endometrium  Thickness: 6 mm.  No focal abnormality visualized. Right ovary Measurements: 1.9 x 1.4 x 2.4 cm = volume: 3.4 mL. Normal appearance/no adnexal mass. Left ovary Measurements: 12.3 x 9.8 x 8.2 cm = volume: 515 mL. Two cysts in the left ovary measure up to 8.5 x 10.3 x 10.4 cm. Follow-up with ultrasound in 8-12 weeks recommended. Pulsed Doppler evaluation demonstrates normal low-resistance arterial and venous waveforms in both ovaries. Other: None IMPRESSION: Left ovarian cysts, otherwise unremarkable pelvic ultrasound. Follow-up with ultrasound in 8-12 weeks recommended. Electronically Signed   By: Anner Crete M.D.   On: 03/25/2021 21:39   DG C-Arm 1-60 Min-No Report  Result Date: 03/21/2021 Fluoroscopy was utilized by the requesting physician.  No radiographic interpretation.   MR ABDOMEN MRCP W WO CONTAST  Result Date: 03/21/2021 CLINICAL DATA:  46 year old female with history of right upper quadrant abdominal pain for the past 2 days. EXAM: MRI ABDOMEN WITHOUT AND WITH CONTRAST (INCLUDING MRCP) TECHNIQUE: Multiplanar multisequence MR imaging of the abdomen was performed both before and after the administration of intravenous contrast. Heavily T2-weighted images of the biliary and pancreatic ducts were obtained, and three-dimensional MRCP images were rendered by post processing. CONTRAST:  70mL GADAVIST GADOBUTROL 1 MMOL/ML IV SOLN COMPARISON:  Abdominal MRI 05/26/2015. FINDINGS: Lower chest: Unremarkable. Hepatobiliary: Mild diffuse loss of signal intensity throughout the hepatic parenchyma on out of phase dual echo images, indicative of a background of mild hepatic steatosis (improved compared to the prior study). No suspicious cystic or solid hepatic lesions. Very mild intra and extrahepatic biliary ductal dilatation is noted on MRCP images. Common bile duct measures up to 7 mm in the porta hepatis. There is abrupt termination of the distal common bile duct immediately above the level of the ampulla,  best appreciated on coronal image 16 of series 3, where there is a strong suggestion of a distal ductal stone, which is difficult to corroborate on additional pulse sequences, but suspected on several, including MRCP image 3 of series 16. There are multiple small filling defects lying dependently within the gallbladder, indicative of gallstones. Gallbladder is only moderately distended. Gallbladder wall does not appear thickened. There is a trace volume of pericholecystic fluid, however. Pancreas: No pancreatic mass. No pancreatic ductal dilatation. No pancreatic or peripancreatic fluid collections or inflammatory changes. Spleen:  Unremarkable. Adrenals/Urinary Tract: Subcentimeter T1 hypointense, T2 hyperintense, nonenhancing lesion in the medial aspect of the upper pole of the right kidney, compatible with a small simple cyst. Left kidney and bilateral adrenal glands are normal in appearance. No hydroureteronephrosis in the visualized portions of the abdomen. Stomach/Bowel: Visualized portions are unremarkable. Vascular/Lymphatic: No aneurysm identified in the visualized abdominal vasculature. No lymphadenopathy noted in the abdomen. Other: No significant volume of ascites noted in the visualized portions of the peritoneal cavity. Musculoskeletal: No aggressive appearing osseous lesions are noted in the visualized portions of the skeleton. IMPRESSION: 1. Study is positive for cholelithiasis. Gallbladder is only moderately distended, however, there is pericholecystic fluid and soft tissue stranding, suggesting acute inflammation. 2. Very mild intra and extrahepatic biliary ductal dilatation with strong suggestion of choledocholithiasis in the very distal aspect of the common bile duct immediately  before the ampulla. If there is clinical evidence of biliary tract obstruction, correlation with ERCP should be considered. 3. Mild hepatic steatosis. Electronically Signed   By: Vinnie Langton M.D.   On: 03/21/2021  05:53   US Abdomen Limited RUQ (LIVER/GB)  Result Date: 03/20/2021 CLINICAL DATA:  Right upper quadrant pain for 2 days. EXAM: ULTRASOUND ABDOMEN LIMITED RIGHT UPPER QUADRANT COMPARISON:  Abdominal MRI 05/26/2015 FINDINGS: Gallbladder: Physiologically distended. Multiple gallstones. No gallbladder wall thickening. There is no pericholecystic fluid. Positive sonographic Murphy sign noted by sonographer. Common bile duct: Diameter: 9 mm, dilated. The distal common bile duct is not well visualized. Liver: Heterogeneous increased parenchymal echogenicity. No focal hepatic lesion. No capsular nodularity. Portal vein is patent on color Doppler imaging with normal direction of blood flow towards the liver. Other: No right upper quadrant ascites. IMPRESSION: 1. Gallstones. No gallbladder wall thickening or pericholecystic fluid, however technologist reports a positive sonographic Murphy sign. This raises concern for acute cholecystitis. 2. Dilated common bile duct at 9 mm. The distal common bile duct is not well seen on the current exam. 3. Hepatic steatosis. Electronically Signed   By: Keith Rake M.D.   On: 03/20/2021 22:01      Subjective: Seen and examined on day of discharge.  Stable no distress.  Pain resolved.  Stable for discharge home.  Discharge Exam: Vitals:   03/27/21 0442 03/27/21 0854  BP: 138/82 (!) 142/83  Pulse: 82 85  Resp: 16 17  Temp: 97.9 F (36.6 C) 98.4 F (36.9 C)  SpO2: 95% 98%   Vitals:   03/26/21 1624 03/26/21 2008 03/27/21 0442 03/27/21 0854  BP: (!) 142/84 (!) 151/80 138/82 (!) 142/83  Pulse: 96 95 82 85  Resp: 18 20 16 17   Temp: 99.1 F (37.3 C) 98.3 F (36.8 C) 97.9 F (36.6 C) 98.4 F (36.9 C)  TempSrc: Oral Oral Oral   SpO2: 94% 95% 95% 98%  Weight:      Height:        General: Pt is alert, awake, not in acute distress Cardiovascular: RRR, S1/S2 +, no rubs, no gallops Respiratory: CTA bilaterally, no wheezing, no rhonchi Abdominal: Soft, NT, ND,  bowel sounds + Extremities: no edema, no cyanosis    The results of significant diagnostics from this hospitalization (including imaging, microbiology, ancillary and laboratory) are listed below for reference.     Microbiology: Recent Results (from the past 240 hour(s))  Resp Panel by RT-PCR (Flu A&B, Covid) Nasopharyngeal Swab     Status: None   Collection Time: 03/20/21  8:30 PM   Specimen: Nasopharyngeal Swab; Nasopharyngeal(NP) swabs in vial transport medium  Result Value Ref Range Status   SARS Coronavirus 2 by RT PCR NEGATIVE NEGATIVE Final    Comment: (NOTE) SARS-CoV-2 target nucleic acids are NOT DETECTED.  The SARS-CoV-2 RNA is generally detectable in upper respiratory specimens during the acute phase of infection. The lowest concentration of SARS-CoV-2 viral copies this assay can detect is 138 copies/mL. A negative result does not preclude SARS-Cov-2 infection and should not be used as the sole basis for treatment or other patient management decisions. A negative result may occur with  improper specimen collection/handling, submission of specimen other than nasopharyngeal swab, presence of viral mutation(s) within the areas targeted by this assay, and inadequate number of viral copies(<138 copies/mL). A negative result must be combined with clinical observations, patient history, and epidemiological information. The expected result is Negative.  Fact Sheet for Patients:  EntrepreneurPulse.com.au  Fact Sheet  for Healthcare Providers:  IncredibleEmployment.be  This test is no t yet approved or cleared by the [place] and  has been authorized for detection and/or diagnosis of SARS-CoV-2 by FDA under an Emergency Use Authorization (EUA). This EUA will remain  in effect (meaning this test can be used) for the duration of the COVID-19 declaration under Section 564(b)(1) of the Act, 21 U.S.C.section 360bbb-3(b)(1), unless the  authorization is terminated  or revoked sooner.       Influenza A by PCR NEGATIVE NEGATIVE Final   Influenza B by PCR NEGATIVE NEGATIVE Final    Comment: (NOTE) The Xpert Xpress SARS-CoV-2/FLU/RSV plus assay is intended as an aid in the diagnosis of influenza from Nasopharyngeal swab specimens and should not be used as a sole basis for treatment. Nasal washings and aspirates are unacceptable for Xpert Xpress SARS-CoV-2/FLU/RSV testing.  Fact Sheet for Patients: EntrepreneurPulse.com.au  Fact Sheet for Healthcare Providers: IncredibleEmployment.be  This test is not yet approved or cleared by the [place] FDA and has been authorized for detection and/or diagnosis of SARS-CoV-2 by FDA under an Emergency Use Authorization (EUA). This EUA will remain in effect (meaning this test can be used) for the duration of the COVID-19 declaration under Section 564(b)(1) of the Act, 21 U.S.C. section 360bbb-3(b)(1), unless the authorization is terminated or revoked.  Performed at [facility], [address] [place], [place] [address]      Labs: BNP (last 3 results) No results for input(s): BNP in the last 8760 hours. Basic Metabolic Panel: Recent Labs  Lab 03/23/21 0515 03/24/21 0514 03/25/21 0716 03/26/21 0525 03/27/21 0611  NA 135 134* 131* 133* 139  K 3.9 3.8 4.0 3.9 4.2  CL 98 96* 94* 95* 98  CO2 27 30 26 29 29   GLUCOSE 128* 159* 155* 132* 95  BUN 8 7 9 9 14   CREATININE 0.52 0.41* 0.60 0.58 0.58  CALCIUM 8.1* 8.1* 8.5* 8.8* 8.4*   Liver Function Tests: Recent Labs  Lab 03/23/21 0515 03/24/21 0514 03/25/21 0716 03/26/21 0525 03/27/21 0611  AST 20 14* 15 14* 13*  ALT 56* 36 28 25 22   ALKPHOS 84 89 107 107 94  BILITOT 0.8 0.7 0.6 0.4 0.3  PROT 6.7 6.7 7.0 6.9 5.8*  ALBUMIN 3.2* 2.9* 3.1* 2.9* 2.6*   Recent Labs  Lab 03/20/21 2030 03/22/21 0516  LIPASE 40 25   No results for input(s): AMMONIA in the last 168  hours. CBC: Recent Labs  Lab 03/23/21 0515 03/24/21 0514 03/25/21 0812 03/26/21 0525 03/27/21 0611  WBC 13.3* 10.1 12.4* 14.5* 12.0*  NEUTROABS 9.8* 7.4 9.4* 10.1* 8.6*  HGB 11.7* 10.9* 11.5* 12.2 11.0*  HCT 35.8* 34.1* 35.2* 36.9 33.9*  MCV 84.4 84.8 84.4 82.7 84.5  PLT 366 307 332 451* 385   Cardiac Enzymes: No results for input(s): CKTOTAL, CKMB, CKMBINDEX, TROPONINI in the last 168 hours. BNP: Invalid input(s): POCBNP CBG: Recent Labs  Lab 03/26/21 1117 03/26/21 1617 03/26/21 2053 03/27/21 0832 03/27/21 1220  GLUCAP 160* 298* 193* 90 173*   D-Dimer No results for input(s): DDIMER in the last 72 hours. Hgb A1c No results for input(s): HGBA1C in the last 72 hours. Lipid Profile No results for input(s): CHOL, HDL, LDLCALC, TRIG, CHOLHDL, LDLDIRECT in the last 72 hours. Thyroid function studies No results for input(s): TSH, T4TOTAL, T3FREE, THYROIDAB in the last 72 hours.  Invalid input(s): FREET3 Anemia work up No results for input(s): VITAMINB12, FOLATE, FERRITIN, TIBC, IRON, RETICCTPCT in the last  72 hours. Urinalysis    Component Value Date/Time   COLORURINE YELLOW (A) 03/20/2021 2030   APPEARANCEUR CLEAR (A) 03/20/2021 2030   LABSPEC 1.015 03/20/2021 2030   PHURINE 6.0 03/20/2021 2030   GLUCOSEU NEGATIVE 03/20/2021 2030   [place] NEGATIVE 03/20/2021 2030   [place] NEGATIVE 03/20/2021 2030   [place] 03/20/2021 2030   PROTEINUR NEGATIVE 03/20/2021 2030   NITRITE NEGATIVE 03/20/2021 2030   LEUKOCYTESUR NEGATIVE 03/20/2021 2030   Sepsis Labs Invalid input(s): PROCALCITONIN,  WBC,  LACTICIDVEN Microbiology Recent Results (from the past 240 hour(s))  Resp Panel by RT-PCR (Flu A&B, Covid) Nasopharyngeal Swab     Status: None   Collection Time: 03/20/21  8:30 PM   Specimen: Nasopharyngeal Swab; Nasopharyngeal(NP) swabs in vial transport medium  Result Value Ref Range Status   SARS Coronavirus 2 by RT PCR NEGATIVE NEGATIVE Final    Comment:  (NOTE) SARS-CoV-2 target nucleic acids are NOT DETECTED.  The SARS-CoV-2 RNA is generally detectable in upper respiratory specimens during the acute phase of infection. The lowest concentration of SARS-CoV-2 viral copies this assay can detect is 138 copies/mL. A negative result does not preclude SARS-Cov-2 infection and should not be used as the sole basis for treatment or other patient management decisions. A negative result may occur with  improper specimen collection/handling, submission of specimen other than nasopharyngeal swab, presence of viral mutation(s) within the areas targeted by this assay, and inadequate number of viral copies(<138 copies/mL). A negative result must be combined with clinical observations, patient history, and epidemiological information. The expected result is Negative.  Fact Sheet for Patients:  EntrepreneurPulse.com.au  Fact Sheet for Healthcare Providers:  IncredibleEmployment.be  This test is no t yet approved or cleared by the [place] FDA and  has been authorized for detection and/or diagnosis of SARS-CoV-2 by FDA under an Emergency Use Authorization (EUA). This EUA will remain  in effect (meaning this test can be used) for the duration of the COVID-19 declaration under Section 564(b)(1) of the Act, 21 U.S.C.section 360bbb-3(b)(1), unless the authorization is terminated  or revoked sooner.       Influenza A by PCR NEGATIVE NEGATIVE Final   Influenza B by PCR NEGATIVE NEGATIVE Final    Comment: (NOTE) The Xpert Xpress SARS-CoV-2/FLU/RSV plus assay is intended as an aid in the diagnosis of influenza from Nasopharyngeal swab specimens and should not be used as a sole basis for treatment. Nasal washings and aspirates are unacceptable for Xpert Xpress SARS-CoV-2/FLU/RSV testing.  Fact Sheet for Patients: EntrepreneurPulse.com.au  Fact Sheet for Healthcare  Providers: IncredibleEmployment.be  This test is not yet approved or cleared by the [place] FDA and has been authorized for detection and/or diagnosis of SARS-CoV-2 by FDA under an Emergency Use Authorization (EUA). This EUA will remain in effect (meaning this test can be used) for the duration of the COVID-19 declaration under Section 564(b)(1) of the Act, 21 U.S.C. section 360bbb-3(b)(1), unless the authorization is terminated or revoked.  Performed at [facility], [address] [place], [place] [address]      Time coordinating discharge: Over 30 minutes  SIGNED:   Sidney Ace, MD  [facility] 03/27/2021, 12:55 PM Pager   If 7PM-7AM, please contact night-coverage

## 2021-03-27 NOTE — Progress Notes (Signed)
Patient medically cleared to discharge with family at bedside to provide transport to home.  Patient received AVS including education from primary RN regarding changes to medications and follow up appointments with all questions and concerns answered by this RN.  Patient's PIV removed prior to discharge and JP drain emptied prior to leaving the unit by primary RN.  Patient was transported off the unit by volunteer at 1345.

## 2021-03-27 NOTE — Progress Notes (Signed)
Post-Operative Day # 1 s/p laparoscopic left salpingectomy with aspiration of adnexal cysts. Also POD#6 s/p ERCP and robotic-assisted cholecystectomy. PMH includes DM, hypothyroidism, PCOS, obesity.   Subjective: no complaints, up ad lib, voiding, tolerating PO, and + flatus.  Patient notes that she is feeling so much better after surgery. No longer experiencing pain.   Scheduled Meds:  amLODipine  5 mg Oral Daily   enoxaparin (LOVENOX) injection  0.5 mg/kg Subcutaneous Q24H   insulin aspart  0-15 Units Subcutaneous TID WC   insulin aspart  0-5 Units Subcutaneous QHS   ketorolac  30 mg Intravenous Q6H   levothyroxine  250 mcg Oral Q0600   methocarbamol  500 mg Oral TID   polyethylene glycol  17 g Oral BID   pregabalin  100 mg Oral TID   senna-docusate  1 tablet Oral BID   simethicone  80 mg Oral QID   Continuous Infusions:  lactated ringers 100 mL/hr at 03/26/21 0851   PRN Meds:.acetaminophen **OR** acetaminophen, ALPRAZolam, alum & mag hydroxide-simeth, bisacodyl, hydrALAZINE, HYDROmorphone (DILAUDID) injection, ondansetron **OR** ondansetron (ZOFRAN) IV, oxyCODONE  Objective: Temp:  [97.9 F (36.6 C)-99.1 F (37.3 C)] 98.4 F (36.9 C) (02/27 0854) Pulse Rate:  [82-98] 85 (02/27 0854) Resp:  [14-23] 17 (02/27 0854) BP: (130-151)/(76-91) 142/83 (02/27 0854) SpO2:  [92 %-100 %] 98 % (02/27 0854)  Physical Exam:  General: alert and no distress  Lungs: clear to auscultation bilaterally Heart: regular rate and rhythm, S1, S2 normal, no murmur, click, rub or gallop Abdomen: soft, non-tender; bowel sounds normal; no masses,  no organomegaly Pelvis: No bleeding.  Incision: healing well, no significant drainage, no dehiscence, no significant erythema. JP drain in place at right upper abdomen incision.  from cholecystectomy, minimal drainage.  Extremities: DVT Evaluation: No evidence of DVT seen on physical exam. Negative Homan's sign. No cords or calf tenderness. No significant  calf/ankle edema.  Labs:  CBC Latest Ref Rng & Units 03/27/2021 03/26/2021 03/25/2021  WBC 4.0 - 10.5 K/uL 12.0(H) 14.5(H) 12.4(H)  Hemoglobin 12.0 - 15.0 g/dL 11.0(L) 12.2 11.5(L)  Hematocrit 36.0 - 46.0 % 33.9(L) 36.9 35.2(L)  Platelets 150 - 400 K/uL 385 451(H) 332    CMP Latest Ref Rng & Units 03/27/2021 03/26/2021 03/25/2021  Glucose 70 - 99 mg/dL PENDING 132(H) 155(H)  BUN 6 - 20 mg/dL 14 9 9   Creatinine 0.44 - 1.00 mg/dL 0.58 0.58 0.60  Sodium 135 - 145 mmol/L 139 133(L) 131(L)  Potassium 3.5 - 5.1 mmol/L 4.2 3.9 4.0  Chloride 98 - 111 mmol/L 98 95(L) 94(L)  CO2 22 - 32 mmol/L 29 29 26   Calcium 8.9 - 10.3 mg/dL 8.4(L) 8.8(L) 8.5(L)  Total Protein 6.5 - 8.1 g/dL 5.8(L) 6.9 7.0  Total Bilirubin 0.3 - 1.2 mg/dL 0.3 0.4 0.6  Alkaline Phos 38 - 126 U/L 94 107 107  AST 15 - 41 U/L 13(L) 14(L) 15  ALT 0 - 44 U/L 22 25 28      Assessment/Plan: Doing well postoperatively. POD # 1 s/p laparoscopic left salpingectomy with aspiration of adnexal cysts. Also POD#6 s/p ERCP and robotic-assisted cholecystectomy.  - Regular diet as tolerated.  - Encourage ambulation - Discharge home today. Can utilize post-op pain meds from cholecystectomy for cyst removal as well.  - To follow up in office in 10-14 days for post-op check.  - Discussed contraception, notes she is not always compliant with her pills. Briefly discussed LARC contraception for pregnancy prevention as well as for prevention of recurrence of cysts.  Patient will likely have placed at post-op visit.     Rubie Maid, MD [facility]     Rubie Maid, MD [facility]

## 2021-03-27 NOTE — Progress Notes (Signed)
Patient ID: Beth Savage, female   DOB: Aug 04, 1975, 46 y.o.   MRN: [id number]     [facility] Day(s): 6.   Interval History: Patient seen and examined, no acute events or new complaints overnight. Patient reports feeling great this morning.  She endorses that the pain has resolved.  She denies pain on the left abdomen.  She has intermittent pain on the right side of the abdomen when the JP drain is activated.  Otherwise no abdominal pain.  She is tolerating diet.  No issues with the wound.  Still with small amount of bilious output.  Vital signs in last 24 hours: [min-max] current  Temp:  [97.9 F (36.6 C)-99.1 F (37.3 C)] 98.4 F (36.9 C) (02/27 0854) Pulse Rate:  [82-98] 85 (02/27 0854) Resp:  [14-23] 17 (02/27 0854) BP: (130-151)/(76-91) 142/83 (02/27 0854) SpO2:  [92 %-100 %] 98 % (02/27 0854)     Height: 5\' 2"  (157.5 cm) Weight: 102.1 kg BMI (Calculated): 41.14   Physical Exam:  Constitutional: alert, cooperative and no distress  Respiratory: breathing non-labored at rest  Cardiovascular: regular rate and sinus rhythm  Gastrointestinal: soft, non-tender, and non-distended  Labs:  CBC Latest Ref Rng & Units 03/27/2021 03/26/2021 03/25/2021  WBC 4.0 - 10.5 K/uL 12.0(H) 14.5(H) 12.4(H)  Hemoglobin 12.0 - 15.0 g/dL 11.0(L) 12.2 11.5(L)  Hematocrit 36.0 - 46.0 % 33.9(L) 36.9 35.2(L)  Platelets 150 - 400 K/uL 385 451(H) 332   CMP Latest Ref Rng & Units 03/27/2021 03/26/2021 03/25/2021  Glucose 70 - 99 mg/dL PENDING 132(H) 155(H)  BUN 6 - 20 mg/dL 14 9 9   Creatinine 0.44 - 1.00 mg/dL 0.58 0.58 0.60  Sodium 135 - 145 mmol/L 139 133(L) 131(L)  Potassium 3.5 - 5.1 mmol/L 4.2 3.9 4.0  Chloride 98 - 111 mmol/L 98 95(L) 94(L)  CO2 22 - 32 mmol/L 29 29 26   Calcium 8.9 - 10.3 mg/dL 8.4(L) 8.8(L) 8.5(L)  Total Protein 6.5 - 8.1 g/dL 5.8(L) 6.9 7.0  Total Bilirubin 0.3 - 1.2 mg/dL 0.3 0.4 0.6  Alkaline Phos 38 - 126 U/L 94 107 107  AST 15 - 41 U/L 13(L) 14(L) 15   ALT 0 - 44 U/L 22 25 28     Imaging studies: No new pertinent imaging studies   Assessment/Plan:  46 y.o. female with choledocholithiasis and acute cholecystitis 6 Day Post-Op s/p ERCP and robotic assisted laparoscopic cholecystectomy.  Today the patient without abdominal pain.  She had laparoscopic aspiration of left ovarian cyst yesterday.  This seems to resolve the pain.  She still have some bilious output through the drain.  From a standpoint patient can be discharged with drain in place.  Patient was instructed of how to take care of the drain and chart drain output daily.  I will see her in the short-term follow-up in my office at the end of the week.  I will send her home with current pain management that includes Lyrica, and NSAIDs, muscle relaxer, and oxycodone for breakthrough pain.  Arnold Long, MD

## 2021-03-27 NOTE — Discharge Instructions (Signed)
°  Diet: Resume home heart healthy regular diet.   Activity: No heavy lifting >20 pounds (children, pets, laundry, garbage) or strenuous activity until follow-up, but light activity and walking are encouraged. Do not drive or drink alcohol if taking narcotic pain medications.  Wound care: May shower with soapy water and pat dry (do not rub incisions), but no baths or submerging incision underwater until follow-up. (no swimming)   Chart drain output daily  Medications: Resume all home medications. For mild to moderate pain: acetaminophen (Tylenol) or ibuprofen (if no kidney disease). Combining Tylenol with alcohol can substantially increase your risk of causing liver disease. Narcotic pain medications, if prescribed, can be used for severe pain, though may cause nausea, constipation, and drowsiness. If you do not need the narcotic pain medication, you do not need to fill the prescription.  Call office [phone]) at any time if any questions, worsening pain, fevers/chills, bleeding, drainage from incision site, or other concerns.

## 2021-03-28 LAB — SURGICAL PATHOLOGY

## 2021-03-30 ENCOUNTER — Other Ambulatory Visit: Payer: Self-pay

## 2021-03-30 ENCOUNTER — Other Ambulatory Visit: Payer: Self-pay | Admitting: General Surgery

## 2021-03-30 ENCOUNTER — Ambulatory Visit
Admission: RE | Admit: 2021-03-30 | Discharge: 2021-03-30 | Disposition: A | Discharge status: Home / Self Care | Payer: BC Managed Care – PPO | Source: Ambulatory Visit | Attending: General Surgery | Admitting: General Surgery

## 2021-03-30 DIAGNOSIS — Z9049 Acquired absence of other specified parts of digestive tract: Secondary | ICD-10-CM

## 2021-03-30 MED ORDER — TECHNETIUM TC 99M MEBROFENIN IV KIT
5.1800 | PACK | Freq: Once | INTRAVENOUS | Status: AC | PRN
  Administered 2021-03-30: 5.18 via INTRAVENOUS

## 2021-04-04 ENCOUNTER — Ambulatory Visit (INDEPENDENT_AMBULATORY_CARE_PROVIDER_SITE_OTHER): Payer: PRIVATE HEALTH INSURANCE | Admitting: Obstetrics and Gynecology

## 2021-04-04 ENCOUNTER — Other Ambulatory Visit (HOSPITAL_COMMUNITY)
Admission: RE | Admit: 2021-04-04 | Discharge: 2021-04-04 | Disposition: A | Discharge status: Home / Self Care | Payer: BC Managed Care – PPO | Source: Ambulatory Visit | Attending: Obstetrics and Gynecology | Admitting: Obstetrics and Gynecology

## 2021-04-04 ENCOUNTER — Encounter: Payer: Self-pay | Admitting: Obstetrics and Gynecology

## 2021-04-04 ENCOUNTER — Other Ambulatory Visit: Payer: Self-pay

## 2021-04-04 VITALS — BP 133/93 | HR 99 | Resp 16 | Ht 62.0 in | Wt 234.9 lb

## 2021-04-04 DIAGNOSIS — Z124 Encounter for screening for malignant neoplasm of cervix: Secondary | ICD-10-CM

## 2021-04-04 DIAGNOSIS — Z9079 Acquired absence of other genital organ(s): Secondary | ICD-10-CM

## 2021-04-04 DIAGNOSIS — Z09 Encounter for follow-up examination after completed treatment for conditions other than malignant neoplasm: Secondary | ICD-10-CM

## 2021-04-04 DIAGNOSIS — R0989 Other specified symptoms and signs involving the circulatory and respiratory systems: Secondary | ICD-10-CM

## 2021-04-04 DIAGNOSIS — E282 Polycystic ovarian syndrome: Secondary | ICD-10-CM

## 2021-04-04 MED ORDER — ETONOGESTREL-ETHINYL ESTRADIOL 0.12-0.015 MG/24HR VA RING: VAGINAL_RING | VAGINAL | 0 refills | Status: DC

## 2021-04-04 NOTE — Patient Instructions (Signed)
Etonogestrel; Ethinyl Estradiol Vaginal Ring What is this medication? ETONOGESTREL; ETHINYL ESTRADIOL (et oh noe JES trel; ETH in il es tra DYE ole) prevents ovulation and pregnancy. It belongs to a group of medications called oral contraceptives. It is a combination of the hormones estrogen and progestin. This medicine may be used for other purposes; ask your health care provider or pharmacist if you have questions. COMMON BRAND NAME(S): EluRyng, NuvaRing What should I tell my care team before I take this medication? They need to know if you have any of these conditions: Abnormal vaginal bleeding Blood vessel disease or blood clots Breast, cervical, endometrial, ovarian, liver, or uterine cancer Diabetes (high blood sugar) Gallbladder disease Having surgery Heart disease or recent heart attack High blood pressure High cholesterol or triglycerides History of irregular heartbeat or heart valve problems Kidney disease Liver disease Migraine headaches Protein C/S deficiency Recently had a baby, miscarriage, or abortion Stroke Systemic lupus erythematosus (SLE) Tobacco smoker An unusual or allergic reaction to estrogens, progestins, other medications, foods, dyes, or preservatives Pregnant or trying to get pregnant Breast-feeding How should I use this medication? Insert the ring into your vagina as directed. Follow the directions on the prescription label. The ring will remain place for 3 weeks and is then removed for a 1-week break. A new ring is inserted 1 week after the last ring was removed, on the same day of the week. Check often to make sure the ring is still in place. If the ring was out of the vagina for an unknown amount of time, you may not be protected from pregnancy. Perform a pregnancy test and call your care team. Do not use more often than directed. A patient package insert for the product will be given with each prescription and refill. Read this sheet carefully each time.  The sheet may change frequently. Contact your care team regarding the use of this medication in children. Special care may be needed. Overdosage: If you think you have taken too much of this medicine contact a poison control center or emergency room at once. NOTE: This medicine is only for you. Do not share this medicine with others. What if I miss a dose? You will need to use the ring exactly as directed. It is very important to follow the schedule every cycle. If you do not use the ring as directed, you may not be protected from pregnancy. If the ring should slip out, is lost, or if you leave it in longer or shorter than you should, contact your care team for advice. What may interact with this medication? Do not take this medication with the following: Dasabuvir; ombitasvir; paritaprevir; ritonavir Ombitasvir; paritaprevir; ritonavir Vaginal lubricants or other vaginal products that are oil-based or silicone-based This medication may also interact with the following: Acetaminophen Antibiotics or medications for infections, especially rifampin, rifabutin, rifapentine, and griseofulvin, and possibly penicillins or tetracyclines Aprepitant or fosaprepitant Armodafinil Ascorbic acid (vitamin C) Barbiturate medications, such as phenobarbital or primidone Bosentan Certain antiviral medications for hepatitis, HIV or AIDS Certain medications for cancer treatment Certain medications for seizures like carbamazepine, clobazam, felbamate, lamotrigine, oxcarbazepine, phenytoin, rufinamide, topiramate Certain medications for treating high cholesterol Cyclosporine Dantrolene Elagolix Flibanserin Grapefruit juice Lesinurad Medications for diabetes Medications to treat fungal infections, such as griseofulvin, miconazole, fluconazole, ketoconazole, itraconazole, posaconazole or voriconazole Mifepristone Mitotane Modafinil Morphine Mycophenolate St. John's wort Tamoxifen Temazepam Theophylline  or aminophylline Thyroid hormones Tizanidine Tranexamic acid Ulipristal Warfarin This list may not describe all possible interactions. Give your health  care provider a list of all the medicines, herbs, non-prescription drugs, or dietary supplements you use. Also tell them if you smoke, drink alcohol, or use illegal drugs. Some items may interact with your medicine. What should I watch for while using this medication? Visit your care team for regular checks on your progress. You will need a regular breast and pelvic exam and Pap smear while on this medication. Check with your care team to see if you need an additional method of contraception during the first cycle that you use this ring. Female condoms (made with natural rubber latex, polyisoprene, and polyurethane) and spermicides may be used. Do not use a diaphragm, cervical cap, or a female condom, as the ring can interfere with these birth control methods and their proper placement. If you have any reason to think you are pregnant, stop using this medication right away and contact your care team. If you are using this medication for hormone related problems, it may take several cycles of use to see improvement in your condition. Smoking increases the risk of getting a blood clot or having a stroke while you are using hormonal birth control, especially if you are older than 45 years old. You are strongly advised not to smoke. Some women are prone to getting dark patches on the skin of the face (cholasma). Your risk of getting chloasma with this medication is higher if you had chloasma during a pregnancy. Keep out of the sun. If you cannot avoid being in the sun, wear protective clothing and use sunscreen. Do not use sun lamps or tanning beds/booths. This medication can make your body retain fluid, making your fingers, hands, or ankles swell. Your blood pressure can go up. Contact your care team if you feel you are retaining fluid. If you are going to  have elective surgery, you may need to stop using this medication before the surgery. Consult your care team for advice. This medication does not protect you against HIV infection (AIDS) or any other sexually transmitted infections. What side effects may I notice from receiving this medication? Side effects that you should report to your care team as soon as possible: Allergic reactions--skin rash, itching, hives, swelling of the face, lips, tongue, or throat Blood clot--pain, swelling, or warmth in the leg, shortness of breath, chest pain Gallbladder problems--severe stomach pain, nausea, vomiting, fever Increase in blood pressure Liver injury--right upper belly pain, loss of appetite, nausea, light-colored stool, dark yellow or brown urine, yellowing skin or eyes, unusual weakness or fatigue New or worsening migraines or headaches Stroke--sudden numbness or weakness of the face, arm, or leg, trouble speaking, confusion, trouble walking, loss of balance or coordination, dizziness, severe headache, change in vision Toxic shock syndrome--fever, headache, general discomfort and fatigue, vomiting, diarrhea, rash or peeling of the skin over hands or feet Unusual vaginal discharge, itching, or odor Vaginal pain, irritation, or sores Worsening mood, feelings of depression Side effects that usually do not require medical attention (report to your care team if they continue or are bothersome): Breast pain or tenderness Dark patches of skin on the face or other sun-exposed areas Irregular menstrual cycles or spotting Nausea Weight gain This list may not describe all possible side effects. Call your doctor for medical advice about side effects. You may report side effects to FDA at 1-800-FDA-1088. Where should I keep my medication? Keep out of the reach of children and pets. Store unopened medication for up to 4 months at room temperature at 15 and 30  degrees C (59 and 86 degrees F). Protect from  light. Do not store above 30 degrees C (86 degrees F). Throw away any unused medication 4 months after the dispense date or the expiration date, whichever comes first. A ring may only be used for 1 cycle (1 month). After the 3-week cycle, a used ring is removed and should be placed in the re-closable foil pouch and discarded in the trash out of reach of children and pets. Do NOT flush down the toilet. NOTE: This sheet is a summary. It may not cover all possible information. If you have questions about this medicine, talk to your doctor, pharmacist, or health care provider.  2022 Elsevier/Gold Standard (2020-03-20 00:00:00)

## 2021-04-04 NOTE — Progress Notes (Signed)
? ? ?OBSTETRICS/GYNECOLOGY POST-OPERATIVE CLINIC VISIT ? ?Subjective:  ?  ? Beth Savage is a 46 y.o. female with h/o PCOS, obesity, DM, Hypothyroidism, anxiety and depression who presents to the clinic 1 weeks status post left salpingectomy and cyst removal for large adnexal mass. Incidentally also ~ 1.5 weeks s/p robotic-assisted laparoscopic ERCP and cholecystectomy. Eating a regular diet without difficulty. Bowel movements are normal. The patient is not having any pain. Does desire to be checked for a rectocele.  Notes that she has had issues with having to splint in the recent past, but also does have a long-standing history of constipation (although none currently).  ? ?The following portions of the patient's history were reviewed and updated as appropriate: ? She  has a past medical history of Anxiety and depression, Diabetes mellitus without complication ([place]), Gallstone, Hypothyroidism, and PCOS (polycystic ovarian syndrome). ? ?She  has a past surgical history that includes no surgery history; ERCP (N/A, 03/21/2021); and Laparoscopic unilateral salpingectomy (Left, 03/26/2021). ? ?Her family history includes Asthma in her daughter; COPD in her mother; Eczema in her daughter; Heart disease in her mother; Rheum arthritis in her maternal grandmother. ? ?She  reports that she has been smoking cigarettes. She has been smoking an average of .25 packs per day. She has never used smokeless tobacco. She reports that she does not currently use alcohol after a past usage of about 2.0 standard drinks per week. She reports current drug use. Drug: Marijuana. ? ?She has a current medication list which includes the following prescription(s): alprazolam, etonogestrel-ethinyl estradiol, ferrous sulfate, levothyroxine, levothyroxine, magnesium, metformin, methocarbamol, nabumetone, ozempic (1 mg/dose), phentermine, senna-docusate, simethicone, trintellix, and vitamin d (ergocalciferol). ? ?She has No Known  Allergies.. ? ?Review of Systems ?A comprehensive review of systems was negative. ?  ?Objective:  ? ?BP (!) 133/93   Pulse 99   Resp 16   Ht '5\' 2"'$  (1.575 m)   Wt 234 lb 14.4 oz (106.5 kg)   LMP 03/26/2021 (Approximate) Comment: Negative urine pregnancy 03-20-2021 ([facility]) Per patient on 03/30/21 "currently on menstral period. It started a few days ago."  BMI 42.96 kg/m?   ? ?General:  alert and no distress  ?Abdomen: soft, bowel sounds active, non-tender  ?Incision:   healing well, no drainage, no erythema, no hernia, no seroma, no swelling, no dehiscence, incision well approximated  ?Pelvis:   external genitalia normal, rectovaginal septum normal.  Vagina without discharge.  Cervix normal appearing, no lesions and no motion tenderness.  Uterus mobile, nontender, normal shape and size.  Adnexae non-palpable, nontender bilaterally.   ?Extremities: Non-tender, no edema  ? ? ?Pathology:  ?FALLOPIAN TUBE AND OVARY, LEFT; SALPINGECTOMY AND CYSTECTOMY:  ?- BENIGN SIMPLE CYST.  ?- PORTIONS OF HISTOLOGICALLY UNREMARKABLE OVARIAN PARENCHYMA.  ?- SEGMENT OF FALLOPIAN TUBE WITH NO SIGNIFICANT HISTOPATHOLOGIC CHANGE.  ?- NEGATIVE FOR MALIGNANCY.  ? ?Assessment:  ? ?Patient s/p laparoscopic left salpingectomy for adnexal cyst ?PCOS ?Labile BPs ?Cervical cancer screening ? ?Plan:  ? ?1. Continue any current medications as instructed by provider. Discussed further management of PCOS, as patient is not always compliant with her pills. Initially considerd use of Nexplanon, but now would prefer to return to NuvaRing which she was on in the past. Discussed risk factors of age and labile BPs. Notes that she is planning to work on weight loss, will resume Phentermine in 2 weeks with her PCP. Will give patient a 3 month supply. If BPs continue to remain elevated (as patient will have monthly visits  with her PCP for weight loss surveillance), will need to consider changing to an progesterone-only method.  ?2. Wound care discussed. ?3.  Operative findings again reviewed. Pathology report discussed. ?4. Pap smear performed today as patient is overdue. No evidence of rectocele noted on today's exam.  ?4. Activity restrictions: none ?5. Anticipated return to work: now. ?6. Follow up: 1 year for annual exam. ? ? ? ?Rubie Maid, MD ?[facility] ? ? ?

## 2021-04-07 LAB — CYTOLOGY - PAP
Comment: NEGATIVE
Diagnosis: NEGATIVE
High risk HPV: NEGATIVE

## 2021-05-03 ENCOUNTER — Encounter: Payer: Self-pay | Admitting: Obstetrics and Gynecology

## 2021-08-03 ENCOUNTER — Ambulatory Visit: Payer: Self-pay | Admitting: Advanced Practice Midwife

## 2021-08-03 ENCOUNTER — Encounter: Payer: Self-pay | Admitting: Advanced Practice Midwife

## 2021-08-03 DIAGNOSIS — B977 Papillomavirus as the cause of diseases classified elsewhere: Secondary | ICD-10-CM

## 2021-08-03 DIAGNOSIS — A599 Trichomoniasis, unspecified: Secondary | ICD-10-CM

## 2021-08-03 DIAGNOSIS — T7411XA Adult physical abuse, confirmed, initial encounter: Secondary | ICD-10-CM | POA: Insufficient documentation

## 2021-08-03 DIAGNOSIS — T7411XS Adult physical abuse, confirmed, sequela: Secondary | ICD-10-CM

## 2021-08-03 DIAGNOSIS — Z113 Encounter for screening for infections with a predominantly sexual mode of transmission: Secondary | ICD-10-CM

## 2021-08-03 DIAGNOSIS — L409 Psoriasis, unspecified: Secondary | ICD-10-CM

## 2021-08-03 DIAGNOSIS — B009 Herpesviral infection, unspecified: Secondary | ICD-10-CM

## 2021-08-03 DIAGNOSIS — F1290 Cannabis use, unspecified, uncomplicated: Secondary | ICD-10-CM

## 2021-08-03 LAB — HM HEPATITIS C SCREENING LAB: HM Hepatitis Screen: NEGATIVE

## 2021-08-03 LAB — HM HIV SCREENING LAB: HM HIV Screening: NEGATIVE

## 2021-08-03 LAB — WET PREP FOR TRICH, YEAST, CLUE
Trichomonas Exam: POSITIVE — AB
Yeast Exam: NEGATIVE

## 2021-08-03 MED ORDER — METRONIDAZOLE 500 MG PO TABS: 500.0000 mg | ORAL_TABLET | Freq: Two times a day (BID) | ORAL | 0 refills | Status: AC

## 2021-08-03 NOTE — Progress Notes (Signed)
Pt here for STD screening.  Wet mount results reviewed.  The patient was dispensed Metronidazole 500 mg today. I provided counseling today regarding the medication. We discussed the medication, the side effects and when to call clinic. Patient given the opportunity to ask questions. Questions answered.  Condoms given.  Windle Guard, RN

## 2021-08-03 NOTE — Progress Notes (Signed)
[facility] Department  STI clinic/screening visit [place] [address] [phone]  Subjective:  Beth Savage is a 46 y.o. SWF exsmoker G3P1 female being seen today for an STI screening visit. The patient reports they do not have symptoms.  Patient reports that they do not desire a pregnancy in the next year.   They reported they are not interested in discussing contraception today.    Patient's last menstrual period was 07/23/2021 (approximate).   Patient has the following medical conditions:   Patient Active Problem List   Diagnosis Date Noted   Morbid obesity ([place]) 08/03/2021   Psoriasis 08/03/2021   Pain    Abdominal pain    Ovarian cyst    S/P cholecystectomy    Cholecystitis, acute with cholelithiasis 03/21/2021   Abnormal MRI, liver    Rash and other nonspecific skin eruption 03/18/2020   Positive ANA (antinuclear antibody) 03/17/2020   Pain in right knee 03/17/2020   PCOS (polycystic ovarian syndrome) 06/17/2018   Acquired hypothyroidism 06/17/2018   Unwanted pregnancy with plans for termination 06/17/2018   Anxiety and depression 06/17/2018   Advanced maternal age in multigravida, first trimester 06/17/2018    Chief Complaint  Patient presents with   SEXUALLY TRANSMITTED DISEASE    Screening     HPI  Patient reports pt here because her partner went to ER with sxs on 08/01/21 and treated for Chlamydia; then he went to his primary care MD soon thereafter and diagnosed with prostatitis. Pt wants STD testing and is asymptomatic. LMP 2 wks ago. Last sex 07/30/21 without condom; with current partner x 1 1/2 years; 2 sex partners in last 3 mo. Last MJ this am. Last ETOH yesterday (1 mixed drink + 1 glass bartishion) q weekend.  Last cocaine, ecstacy, and LSD in 20's and 30's. Using Nuvaring  Last HIV test per patient/review of record was unknown per pt Patient reports last pap was 03/2021 neg per pt  Screening for MPX  risk: Does the patient have an unexplained rash? No Is the patient MSM? No Does the patient endorse multiple sex partners or anonymous sex partners? Yes Did the patient have close or sexual contact with a person diagnosed with MPX? No Has the patient traveled outside the [place] where MPX is endemic? No Is there a high clinical suspicion for MPX-- evidenced by one of the following No  -Unlikely to be chickenpox  -Lymphadenopathy  -Rash that present in same phase of evolution on any given body part See flowsheet for further details and programmatic requirements.   Immunization history:   There is no immunization history on file for this patient.   The following portions of the patient's history were reviewed and updated as appropriate: allergies, current medications, past medical history, past social history, past surgical history and problem list.  Objective:  There were no vitals filed for this visit.  Physical Exam Vitals and nursing note reviewed.  Constitutional:      Appearance: Normal appearance. She is obese.  HENT:     Head: Normocephalic and atraumatic.     Mouth/Throat:     Mouth: Mucous membranes are moist.     Pharynx: Oropharynx is clear. No oropharyngeal exudate or posterior oropharyngeal erythema.  Eyes:     Conjunctiva/sclera: Conjunctivae normal.  Pulmonary:     Effort: Pulmonary effort is normal.  Abdominal:     Palpations: Abdomen is soft. There is no mass.     Tenderness: There is no  abdominal tenderness. There is no rebound.     Comments: Poor tone, soft without masses or tendenrness, increased adipose  Genitourinary:    General: Normal vulva.     Exam position: Lithotomy position.     Pubic Area: No rash or pubic lice.      Labia:        Right: No rash or lesion.        Left: No rash or lesion.      Vagina: Vaginal discharge (white creamy leukorrhea, ph<4.5) present. No erythema, bleeding or lesions.     Cervix: Normal.     Uterus: Normal.      Rectum:  Normal.     Comments: pH = <4.5 Lymphadenopathy:     Head:     Right side of head: No preauricular or posterior auricular adenopathy.     Left side of head: No preauricular or posterior auricular adenopathy.     Cervical: No cervical adenopathy.     Right cervical: No superficial, deep or posterior cervical adenopathy.    Left cervical: No superficial, deep or posterior cervical adenopathy.     Upper Body:     Right upper body: No supraclavicular, axillary or epitrochlear adenopathy.     Left upper body: No supraclavicular, axillary or epitrochlear adenopathy.     Lower Body: No right inguinal adenopathy. No left inguinal adenopathy.  Skin:    General: Skin is warm and dry.     Findings: No rash.  Neurological:     Mental Status: She is alert and oriented to person, place, and time.      Assessment and Plan:  Beth Savage is a 46 y.o. female presenting to the [facility] Department for STI screening  1. Morbid obesity ([place])   2. Psoriasis   3. Screening examination for venereal disease Treat wet mount per standing orders Immunization nurse consult  - WET PREP FOR [place], YEAST, CLUE - Chlamydia/Gonorrhea [place] [facility] - HIV/HCV [place] [facility] - Syphilis Serology, [place] [facility] - Gonococcus culture     No follow-ups on file.  No future appointments.  Herbie Saxon, CNM

## 2021-08-03 NOTE — Addendum Note (Signed)
Addended by: Windle Guard on: 08/03/2021 04:10 PM   Modules accepted: Orders

## 2021-08-07 LAB — GONOCOCCUS CULTURE

## 2021-11-10 ENCOUNTER — Other Ambulatory Visit: Payer: Self-pay | Admitting: Obstetrics and Gynecology

## 2021-12-07 ENCOUNTER — Other Ambulatory Visit (HOSPITAL_COMMUNITY): Payer: Self-pay

## 2021-12-07 MED ORDER — OZEMPIC (2 MG/DOSE) 8 MG/3ML ~~LOC~~ SOPN
PEN_INJECTOR | SUBCUTANEOUS | 1 refills | Status: DC
  Filled 2021-12-07: qty 3, 28d supply, fill #0

## 2022-02-08 ENCOUNTER — Other Ambulatory Visit (HOSPITAL_COMMUNITY): Payer: Self-pay

## 2022-02-08 MED ORDER — OZEMPIC (2 MG/DOSE) 8 MG/3ML ~~LOC~~ SOPN
2.0000 mg | PEN_INJECTOR | SUBCUTANEOUS | 1 refills | Status: DC
  Filled 2022-02-08: qty 3, 28d supply, fill #0

## 2022-03-08 ENCOUNTER — Other Ambulatory Visit (HOSPITAL_COMMUNITY): Payer: Self-pay

## 2022-03-08 MED ORDER — OZEMPIC (2 MG/DOSE) 8 MG/3ML ~~LOC~~ SOPN
2.0000 mg | PEN_INJECTOR | SUBCUTANEOUS | 1 refills | Status: DC
  Filled 2022-03-08: qty 3, 28d supply, fill #0

## 2022-05-03 ENCOUNTER — Other Ambulatory Visit (HOSPITAL_COMMUNITY): Payer: Self-pay

## 2022-05-03 MED ORDER — OZEMPIC (2 MG/DOSE) 8 MG/3ML ~~LOC~~ SOPN
2.0000 mg | PEN_INJECTOR | SUBCUTANEOUS | 1 refills | Status: DC
  Filled 2022-05-03: qty 3, 28d supply, fill #0

## 2022-05-11 ENCOUNTER — Other Ambulatory Visit (HOSPITAL_COMMUNITY): Payer: Self-pay

## 2022-09-28 ENCOUNTER — Other Ambulatory Visit (HOSPITAL_COMMUNITY): Payer: Self-pay

## 2022-09-28 MED ORDER — OZEMPIC (2 MG/DOSE) 8 MG/3ML ~~LOC~~ SOPN
2.0000 mg | PEN_INJECTOR | SUBCUTANEOUS | 1 refills | Status: AC
  Filled 2022-09-28: qty 9, 84d supply, fill #0

## 2022-12-28 IMAGING — CT CT ABD-PELV W/ CM
2 of 5 series · 15 of 46 positions shown, 17 images · IV contrast (APPLIED)
Comparison: MRI of the abdomen on 03/21/2021

CLINICAL DATA: Postop abdominal pain. Choledocholithiasis and acute
cholecystitis. Four days postop from ERCP and robotic assisted
laparoscopic cholecystectomy. Worsening pain.

EXAM:
CT ABDOMEN AND PELVIS WITH CONTRAST
TECHNIQUE: Multidetector CT imaging of the abdomen and pelvis was performed
using the standard protocol following bolus administration of
intravenous contrast.

[Series 2: axial st · axial · 0.84mm/px · z∈[-1103,-668]mm · 12 of 103 slices shown, 14 images]
[im 8/103  soft-tissue]
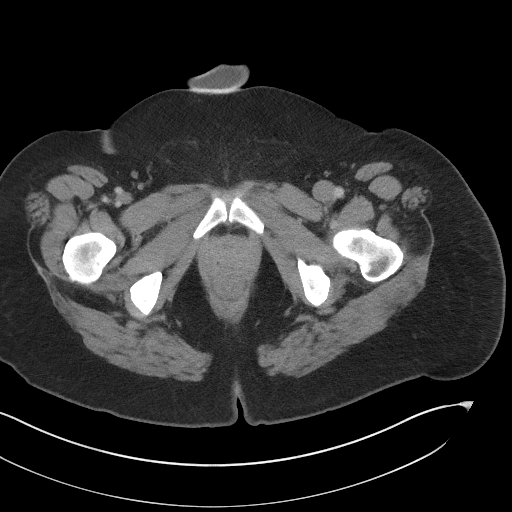
[im 8/103  bone]
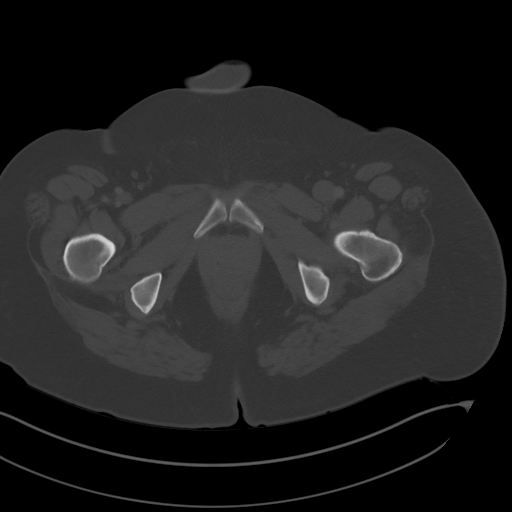
[im 16/103  soft-tissue]
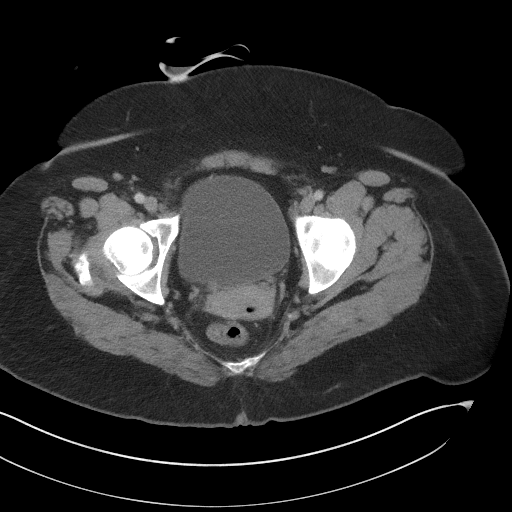
[im 24/103  soft-tissue]
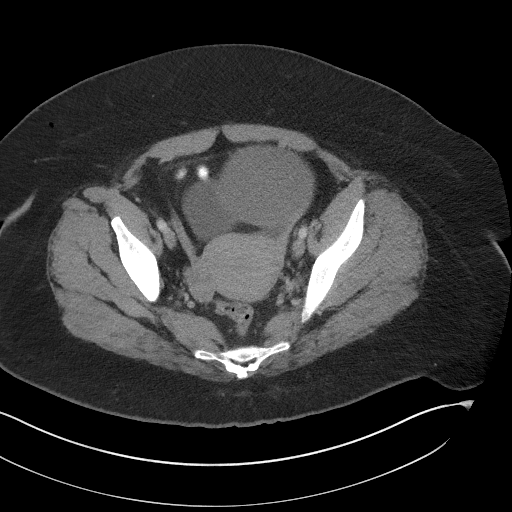
[im 32/103  soft-tissue]
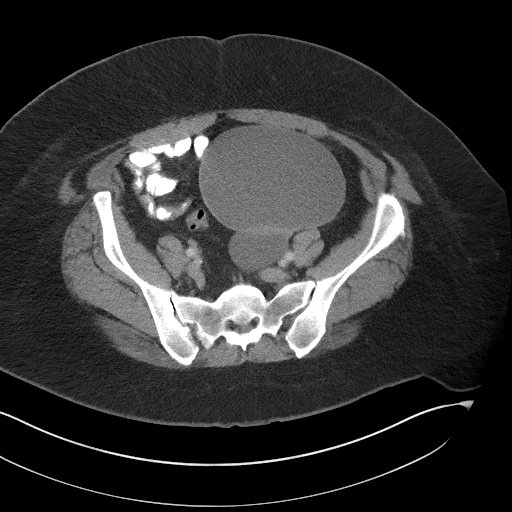
[im 40/103  soft-tissue]
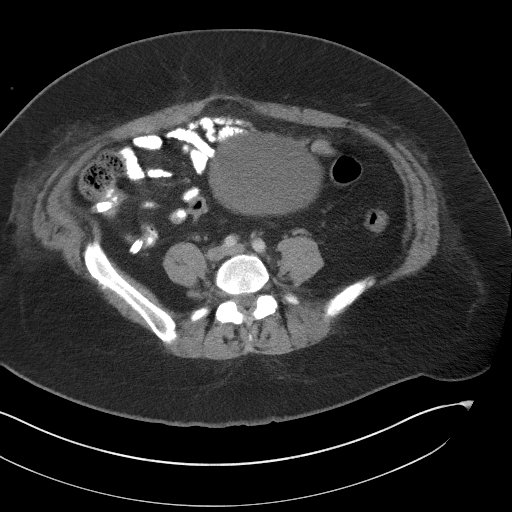
[im 48/103  soft-tissue]
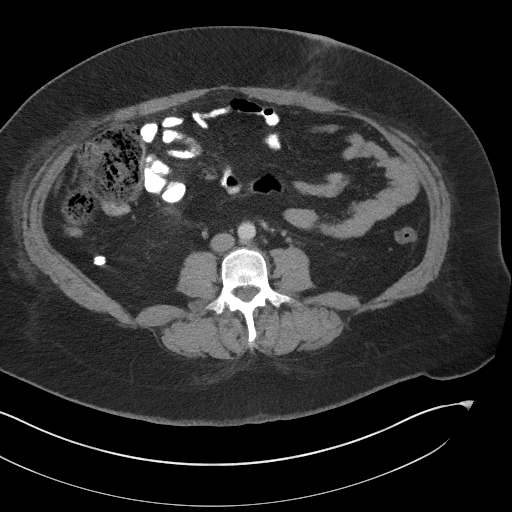
[im 55/103  soft-tissue]
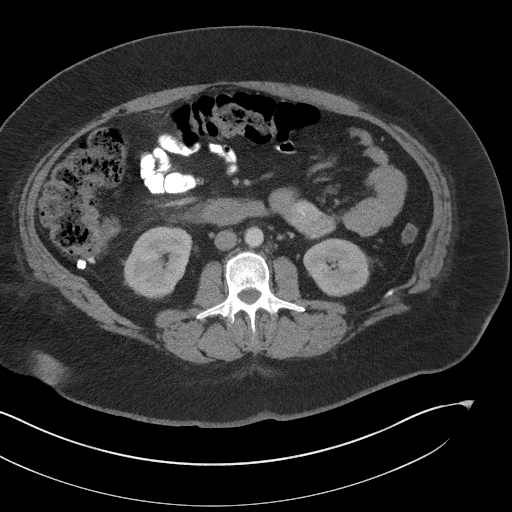
[im 63/103  soft-tissue]
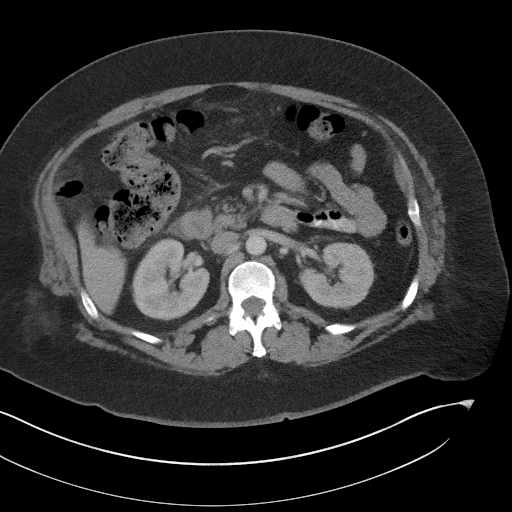
[im 71/103  soft-tissue]
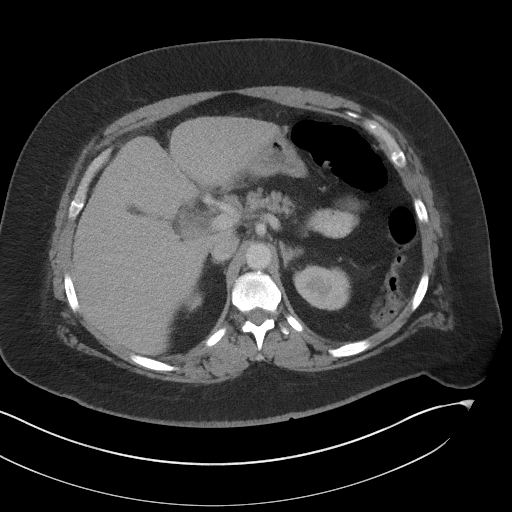
[im 71/103  bone]
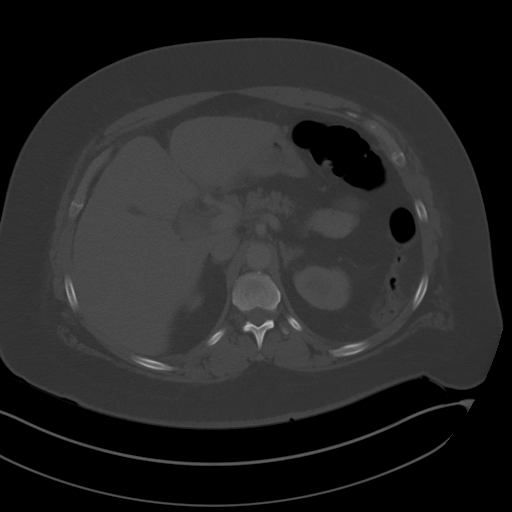
[im 79/103  soft-tissue]
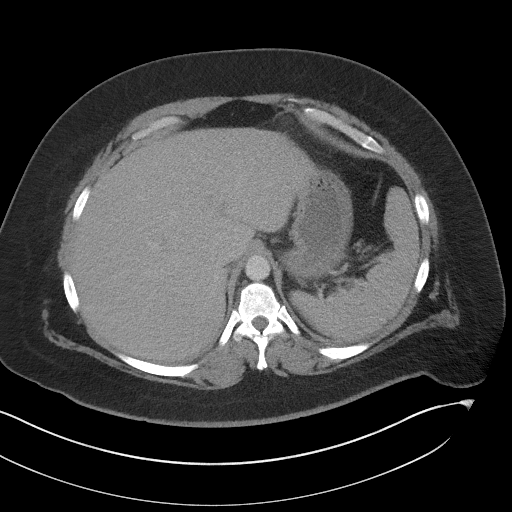
[im 87/103  soft-tissue]
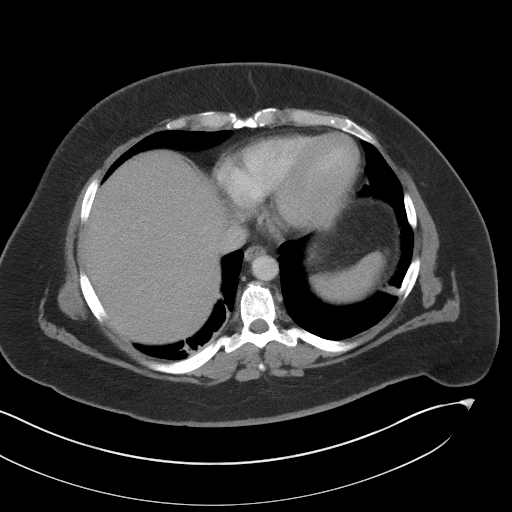
[im 95/103  soft-tissue]
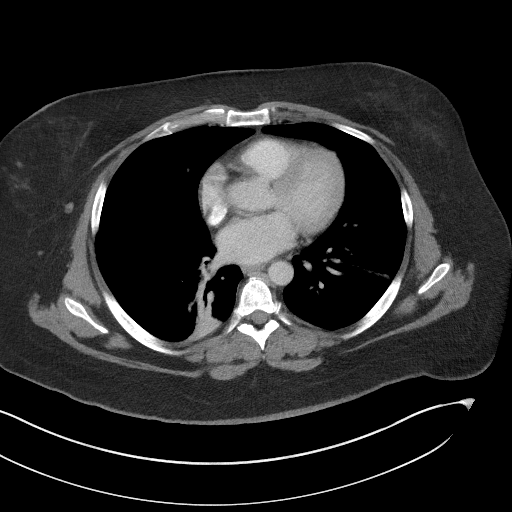

[Series 5: coronal st · coronal · 0.98mm/px · 3 of 113 slices shown]
[im 38/113  soft-tissue]
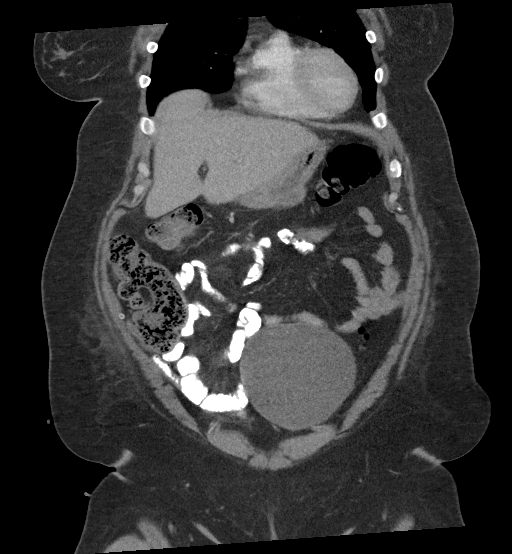
[im 50/113  soft-tissue]
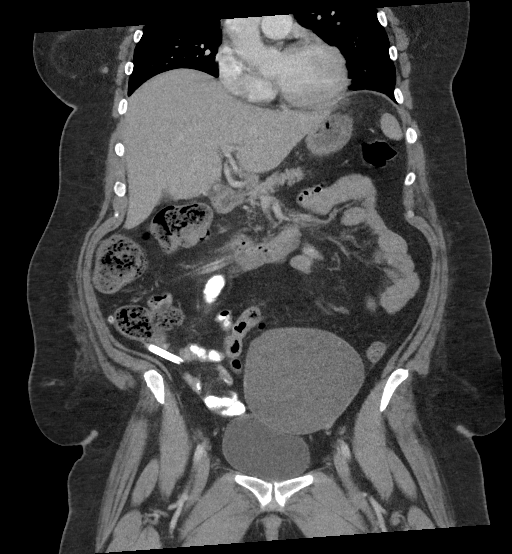
[im 63/113  soft-tissue]
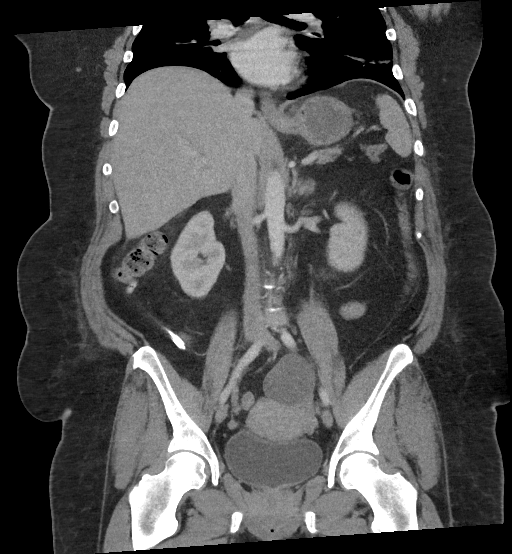

[15 of 46 positions shown; findings below may reference images not displayed]

RADIATION DOSE REDUCTION: This exam was performed according to the
departmental dose-optimization program which includes automated
exposure control, adjustment of the mA and/or kV according to
patient size and/or use of iterative reconstruction technique.

CONTRAST:  100mL OMNIPAQUE IOHEXOL 300 MG/ML  SOLN
FINDINGS: Lower chest: There is bibasilar atelectasis, RIGHT greater than
LEFT. Heart size is normal.

Hepatobiliary: There is focal fatty infiltration adjacent to the
falciform ligament. Liver is homogeneous. Previous cholecystectomy.
The gallbladder fossa contains a trace amount of fluid but no air.
Small amount of fluid tracks from the gallbladder fossa and is seen
adjacent to the descending portion of the duodenum. No associated
gas or evidence for abscess in this region. Common bile duct is
normal in appearance. No radiopaque calculi identified within the
biliary tree. No biliary duct dilatation.

Pancreas: Unremarkable. No pancreatic ductal dilatation or
surrounding inflammatory changes.

Spleen: Normal in size without focal abnormality.

Adrenals/Urinary Tract: Adrenal glands are normal. Kidneys and
ureters are unremarkable. The bladder and visualized portion of the
urethra are normal.

Stomach/Bowel: Stomach is unremarkable. Small amount of fluid around
the descending duodenum. Otherwise, small bowel loops are normal in
appearance. Appendix is normal. Large bowel is decompressed with
rare colonic diverticula. No acute inflammatory changes. RIGHT LOWER
QUADRANT surgical drain is in place, not associated with fluid
collection.

Vascular/Lymphatic: No significant vascular findings are present. No
enlarged abdominal or pelvic lymph nodes.

Reproductive: The uterus is present. RIGHT ovary has a normal CT
appearance. There are 2 low-attenuation structures in the LEFT
adnexal regions measuring 4.5 x 3.8 centimeters and 12.0 x 8.2 x
11.0 Centimeters. Further characterization of these adnexal cyst is
needed.

Other: Abdominal wall is unremarkable. No ascites. No free
intraperitoneal air.

Musculoskeletal: No acute or significant osseous findings.
IMPRESSION: 1. Large LEFT adnexal cystic masses measuring 4.5 and
centimeters. Further characterization with transabdominal and
endovaginal pelvic ultrasound recommended.
2. Interval cholecystectomy. Trace amount of fluid in the
gallbladder fossa and surrounding the descending portion of the
duodenum are likely postoperative. No associated air or evidence for
abscess in these regions. No radiopaque calculi or evidence for
biliary tract dilatation.
3. Unremarkable course of the RIGHT LOWER QUADRANT drain, without
surrounding fluid.
4. Hepatic steatosis.
5. Minimal colonic diverticulosis without acute diverticulitis.
6. RIGHT ovary has a normal CT appearance.

## 2023-01-02 IMAGING — NM NM HEPATOBILIARY SCAN
1 series · 6 of 6 positions shown · non-contrast
Comparison: CT AP 03/25/2021

CLINICAL DATA: Status post cholecystectomy. Evaluate for bile leak.

EXAM:
NUCLEAR MEDICINE HEPATOBILIARY IMAGING
TECHNIQUE: Sequential images of the abdomen were obtained [DATE] minutes
following intravenous administration of radiopharmaceutical.
RADIOPHARMACEUTICALS:  5.18 mCi Bc-FFm  Choletec IV

[Series 1000: bile leak scan · 9.59mm/px · 6 of 60 frames shown]
[frame 6/60]
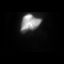
[frame 16/60]
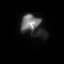
[frame 26/60]
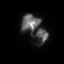
[frame 36/60]
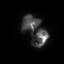
[frame 46/60]
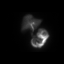
[frame 56/60]
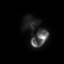

[6 of 6 positions shown; findings below may reference images not displayed]

FINDINGS: Prompt uptake and biliary excretion of activity by the liver is
seen. Biliary activity passes into small bowel, consistent with
patent common bile duct. No extravasation of the radiopharmaceutical
identified to suggest active bile leak.
IMPRESSION: 1. No signs to suggest active bile leak.
2. Patent common bile duct.

## 2023-02-07 ENCOUNTER — Other Ambulatory Visit (HOSPITAL_BASED_OUTPATIENT_CLINIC_OR_DEPARTMENT_OTHER): Payer: Self-pay

## 2023-02-07 ENCOUNTER — Other Ambulatory Visit (HOSPITAL_COMMUNITY): Payer: Self-pay

## 2023-02-07 MED ORDER — NA SULFATE-K SULFATE-MG SULF 17.5-3.13-1.6 GM/177ML PO SOLN
ORAL | 0 refills | Status: AC
  Filled 2023-02-07: qty 354, 1d supply, fill #0

## 2023-02-11 ENCOUNTER — Other Ambulatory Visit (HOSPITAL_COMMUNITY): Payer: Self-pay

## 2023-02-11 MED ORDER — VITAMIN D (ERGOCALCIFEROL) 1.25 MG (50000 UNIT) PO CAPS
50000.0000 [IU] | ORAL_CAPSULE | ORAL | 1 refills | Status: AC
  Filled 2023-02-11: qty 12, 84d supply, fill #0

## 2023-02-11 MED ORDER — MAGNESIUM OXIDE -MG SUPPLEMENT 500 MG PO TABS
ORAL_TABLET | ORAL | 1 refills | Status: AC
  Filled 2023-02-11 – 2023-04-02 (×2): qty 90, 90d supply, fill #0

## 2023-02-11 MED ORDER — VITAMIN D (ERGOCALCIFEROL) 1.25 MG (50000 UNIT) PO CAPS
50000.0000 [IU] | ORAL_CAPSULE | ORAL | 5 refills | Status: AC
  Filled 2023-02-11: qty 4, 28d supply, fill #0

## 2023-02-11 MED ORDER — OZEMPIC (2 MG/DOSE) 8 MG/3ML ~~LOC~~ SOPN: 2.0000 mg | PEN_INJECTOR | SUBCUTANEOUS | 1 refills | Status: AC

## 2023-02-11 MED ORDER — PHENTERMINE HCL 37.5 MG PO TABS
37.5000 mg | ORAL_TABLET | Freq: Every day | ORAL | 0 refills | Status: DC
  Filled 2023-02-11: qty 30, 30d supply, fill #0

## 2023-02-11 MED ORDER — OZEMPIC (2 MG/DOSE) 8 MG/3ML ~~LOC~~ SOPN
2.0000 mg | PEN_INJECTOR | SUBCUTANEOUS | 1 refills | Status: AC
  Filled 2023-02-11: qty 3, 28d supply, fill #0

## 2023-02-11 MED ORDER — TRINTELLIX 20 MG PO TABS
20.0000 mg | ORAL_TABLET | Freq: Every day | ORAL | 1 refills | Status: AC
  Filled 2023-02-11: qty 90, 90d supply, fill #0

## 2023-02-11 MED ORDER — OZEMPIC (1 MG/DOSE) 4 MG/3ML ~~LOC~~ SOPN
1.0000 mg | PEN_INJECTOR | SUBCUTANEOUS | 1 refills | Status: AC
  Filled 2023-02-11: qty 3, 28d supply, fill #0

## 2023-02-11 MED ORDER — ALPRAZOLAM 1 MG PO TABS
1.0000 mg | ORAL_TABLET | Freq: Three times a day (TID) | ORAL | 0 refills | Status: DC | PRN
Start: 2023-02-11 — End: 2023-03-15
  Filled 2023-02-11: qty 90, 30d supply, fill #0

## 2023-02-12 ENCOUNTER — Other Ambulatory Visit (HOSPITAL_COMMUNITY): Payer: Self-pay

## 2023-02-22 ENCOUNTER — Other Ambulatory Visit (HOSPITAL_COMMUNITY): Payer: Self-pay

## 2023-02-28 ENCOUNTER — Other Ambulatory Visit (HOSPITAL_COMMUNITY): Payer: Self-pay

## 2023-03-15 ENCOUNTER — Other Ambulatory Visit (HOSPITAL_COMMUNITY): Payer: Self-pay

## 2023-03-15 ENCOUNTER — Other Ambulatory Visit: Payer: Self-pay

## 2023-03-15 MED ORDER — LINZESS 72 MCG PO CAPS
72.0000 ug | ORAL_CAPSULE | ORAL | 6 refills | Status: AC | PRN
  Filled 2023-03-15: qty 30, 30d supply, fill #0
  Filled 2023-04-02: qty 90, 90d supply, fill #0

## 2023-03-15 MED ORDER — ERGOCALCIFEROL 1.25 MG (50000 UT) PO CAPS
50000.0000 [IU] | ORAL_CAPSULE | ORAL | 5 refills | Status: AC
  Filled 2023-03-15 – 2023-04-02 (×2): qty 4, 28d supply, fill #0

## 2023-03-15 MED ORDER — ALPRAZOLAM 1 MG PO TABS
1.0000 mg | ORAL_TABLET | Freq: Three times a day (TID) | ORAL | 0 refills | Status: AC | PRN
  Filled 2023-03-15 – 2023-04-02 (×2): qty 90, 30d supply, fill #0

## 2023-03-15 MED ORDER — OZEMPIC (2 MG/DOSE) 8 MG/3ML ~~LOC~~ SOPN
2.0000 mg | PEN_INJECTOR | SUBCUTANEOUS | 1 refills | Status: AC
  Filled 2023-03-15: qty 9, 84d supply, fill #0
  Filled 2023-04-02: qty 3, 28d supply, fill #0

## 2023-03-15 MED ORDER — PHENTERMINE HCL 37.5 MG PO TABS
37.5000 mg | ORAL_TABLET | Freq: Every day | ORAL | 0 refills | Status: DC
  Filled 2023-03-15 – 2023-04-02 (×2): qty 30, 30d supply, fill #0

## 2023-03-25 ENCOUNTER — Other Ambulatory Visit (HOSPITAL_COMMUNITY): Payer: Self-pay

## 2023-04-02 ENCOUNTER — Other Ambulatory Visit (HOSPITAL_COMMUNITY): Payer: Self-pay

## 2023-04-13 ENCOUNTER — Other Ambulatory Visit (HOSPITAL_COMMUNITY): Payer: Self-pay

## 2023-04-13 MED ORDER — CHOLECALCIFEROL 1.25 MG (50000 UT) PO CAPS
50000.0000 [IU] | ORAL_CAPSULE | ORAL | 5 refills | Status: AC
  Filled 2023-04-13: qty 4, 28d supply, fill #0

## 2023-04-13 MED ORDER — ALPRAZOLAM 1 MG PO TABS
ORAL_TABLET | ORAL | 0 refills | Status: AC
  Filled 2023-04-13: qty 90, 30d supply, fill #0

## 2023-04-13 MED ORDER — PHENTERMINE HCL 37.5 MG PO TABS
37.5000 mg | ORAL_TABLET | Freq: Every day | ORAL | 0 refills | Status: AC
  Filled 2023-04-13: qty 30, 30d supply, fill #0

## 2023-04-15 ENCOUNTER — Other Ambulatory Visit: Payer: Self-pay

## 2023-04-25 ENCOUNTER — Other Ambulatory Visit (HOSPITAL_COMMUNITY): Payer: Self-pay

## 2023-05-13 ENCOUNTER — Other Ambulatory Visit (HOSPITAL_COMMUNITY): Payer: Self-pay

## 2023-05-13 MED ORDER — TRINTELLIX 20 MG PO TABS
20.0000 mg | ORAL_TABLET | Freq: Every day | ORAL | 1 refills | Status: AC
  Filled 2023-05-13: qty 90, 90d supply, fill #0

## 2023-05-13 MED ORDER — ALPRAZOLAM 1 MG PO TABS
1.0000 mg | ORAL_TABLET | Freq: Three times a day (TID) | ORAL | 3 refills | Status: AC | PRN
  Filled 2023-05-13 – 2023-06-01 (×2): qty 90, 30d supply, fill #0

## 2023-05-13 MED ORDER — ERGOCALCIFEROL 1.25 MG (50000 UT) PO CAPS
50000.0000 [IU] | ORAL_CAPSULE | ORAL | 5 refills | Status: AC
  Filled 2023-05-13 – 2023-06-01 (×2): qty 4, 28d supply, fill #0

## 2023-05-13 MED ORDER — PRAVASTATIN SODIUM 80 MG PO TABS
80.0000 mg | ORAL_TABLET | Freq: Every evening | ORAL | 1 refills | Status: AC
  Filled 2023-05-13: qty 90, 90d supply, fill #0

## 2023-05-13 MED ORDER — OZEMPIC (2 MG/DOSE) 8 MG/3ML ~~LOC~~ SOPN
2.0000 mg | PEN_INJECTOR | SUBCUTANEOUS | 1 refills | Status: AC
  Filled 2023-05-13 – 2023-06-01 (×2): qty 3, 28d supply, fill #0

## 2023-05-13 MED ORDER — PHENTERMINE HCL 37.5 MG PO TABS
37.5000 mg | ORAL_TABLET | Freq: Every day | ORAL | 0 refills | Status: AC
  Filled 2023-05-13 – 2023-06-01 (×2): qty 30, 30d supply, fill #0

## 2023-05-13 MED ORDER — LINZESS 72 MCG PO CAPS
72.0000 ug | ORAL_CAPSULE | ORAL | 6 refills | Status: AC | PRN
  Filled 2023-05-13: qty 30, 30d supply, fill #0

## 2023-05-25 ENCOUNTER — Other Ambulatory Visit (HOSPITAL_COMMUNITY): Payer: Self-pay

## 2023-06-01 ENCOUNTER — Other Ambulatory Visit (HOSPITAL_COMMUNITY): Payer: Self-pay
